# Patient Record
Sex: Female | Born: 1940 | Race: White | Hispanic: No | Marital: Married | Smoking: Never smoker
Health system: Southern US, Community
[De-identification: ages and names within clinical notes are randomized; demographics above are authoritative.]

## PROBLEM LIST (undated history)

## (undated) DIAGNOSIS — E119 Type 2 diabetes mellitus without complications: Secondary | ICD-10-CM

## (undated) DIAGNOSIS — E785 Hyperlipidemia, unspecified: Secondary | ICD-10-CM

## (undated) DIAGNOSIS — E890 Postprocedural hypothyroidism: Secondary | ICD-10-CM

## (undated) DIAGNOSIS — C73 Malignant neoplasm of thyroid gland: Secondary | ICD-10-CM

## (undated) DIAGNOSIS — C50919 Malignant neoplasm of unspecified site of unspecified female breast: Secondary | ICD-10-CM

## (undated) DIAGNOSIS — F419 Anxiety disorder, unspecified: Secondary | ICD-10-CM

## (undated) DIAGNOSIS — R21 Rash and other nonspecific skin eruption: Secondary | ICD-10-CM

## (undated) DIAGNOSIS — H359 Unspecified retinal disorder: Secondary | ICD-10-CM

## (undated) DIAGNOSIS — H938X9 Other specified disorders of ear, unspecified ear: Secondary | ICD-10-CM

## (undated) DIAGNOSIS — I10 Essential (primary) hypertension: Secondary | ICD-10-CM

## (undated) HISTORY — DX: Postprocedural hypothyroidism: E89.0

## (undated) HISTORY — DX: Anxiety disorder, unspecified: F41.9

## (undated) HISTORY — DX: Hyperlipidemia, unspecified: E78.5

## (undated) HISTORY — DX: Malignant neoplasm of thyroid gland: C73

## (undated) HISTORY — DX: Rash and other nonspecific skin eruption: R21

## (undated) HISTORY — DX: Malignant neoplasm of unspecified site of unspecified female breast: C50.919

## (undated) HISTORY — DX: Type 2 diabetes mellitus without complications: E11.9

## (undated) HISTORY — DX: Essential (primary) hypertension: I10

## (undated) HISTORY — DX: Unspecified retinal disorder: H35.9

## (undated) HISTORY — PX: MASTECTOMY: SHX3

## (undated) HISTORY — PX: OOPHORECTOMY: SHX86

## (undated) HISTORY — PX: ABDOMINAL HYSTERECTOMY: SHX81

## (undated) HISTORY — PX: THYROIDECTOMY: SHX17

## (undated) HISTORY — DX: Other specified disorders of ear, unspecified ear: H93.8X9

---

## 1989-06-04 DIAGNOSIS — C50919 Malignant neoplasm of unspecified site of unspecified female breast: Secondary | ICD-10-CM

## 1989-06-04 HISTORY — DX: Malignant neoplasm of unspecified site of unspecified female breast: C50.919

## 2005-06-04 DIAGNOSIS — C73 Malignant neoplasm of thyroid gland: Secondary | ICD-10-CM

## 2005-06-04 HISTORY — DX: Malignant neoplasm of thyroid gland: C73

## 2009-06-04 LAB — HM DEXA SCAN

## 2012-01-22 ENCOUNTER — Other Ambulatory Visit (HOSPITAL_BASED_OUTPATIENT_CLINIC_OR_DEPARTMENT_OTHER): Payer: Self-pay | Admitting: Family Medicine

## 2012-01-22 DIAGNOSIS — Z1231 Encounter for screening mammogram for malignant neoplasm of breast: Secondary | ICD-10-CM

## 2012-02-06 ENCOUNTER — Ambulatory Visit (HOSPITAL_BASED_OUTPATIENT_CLINIC_OR_DEPARTMENT_OTHER): Payer: Self-pay

## 2012-02-06 ENCOUNTER — Ambulatory Visit (HOSPITAL_BASED_OUTPATIENT_CLINIC_OR_DEPARTMENT_OTHER)
Admission: RE | Admit: 2012-02-06 | Discharge: 2012-02-06 | Disposition: A | Discharge status: Home / Self Care | Payer: Medicare Other | Source: Ambulatory Visit | Attending: Family Medicine | Admitting: Family Medicine

## 2012-02-06 DIAGNOSIS — Z1231 Encounter for screening mammogram for malignant neoplasm of breast: Secondary | ICD-10-CM

## 2012-02-06 LAB — HM MAMMOGRAPHY

## 2012-08-16 DIAGNOSIS — E890 Postprocedural hypothyroidism: Secondary | ICD-10-CM | POA: Insufficient documentation

## 2012-08-16 DIAGNOSIS — E119 Type 2 diabetes mellitus without complications: Secondary | ICD-10-CM | POA: Insufficient documentation

## 2012-08-16 DIAGNOSIS — C50919 Malignant neoplasm of unspecified site of unspecified female breast: Secondary | ICD-10-CM | POA: Insufficient documentation

## 2012-08-22 ENCOUNTER — Ambulatory Visit (INDEPENDENT_AMBULATORY_CARE_PROVIDER_SITE_OTHER): Payer: Medicare Other | Admitting: Family Medicine

## 2012-08-22 ENCOUNTER — Encounter: Payer: Self-pay | Admitting: Family Medicine

## 2012-08-22 VITALS — BP 152/78 | HR 66 | Wt 125.0 lb

## 2012-08-22 DIAGNOSIS — E039 Hypothyroidism, unspecified: Secondary | ICD-10-CM

## 2012-08-22 DIAGNOSIS — Z5181 Encounter for therapeutic drug level monitoring: Secondary | ICD-10-CM

## 2012-08-22 DIAGNOSIS — IMO0001 Reserved for inherently not codable concepts without codable children: Secondary | ICD-10-CM

## 2012-08-22 DIAGNOSIS — E785 Hyperlipidemia, unspecified: Secondary | ICD-10-CM

## 2012-08-22 MED ORDER — METFORMIN HCL 500 MG PO TABS: 500.0000 mg | ORAL_TABLET | Freq: Two times a day (BID) | ORAL | Status: DC

## 2012-08-22 MED ORDER — SIMVASTATIN 5 MG PO TABS: 5.0000 mg | ORAL_TABLET | Freq: Every day | ORAL | Status: DC

## 2012-08-22 NOTE — Patient Instructions (Addendum)
Diabetes and Exercise  Regular exercise is important and can help:   · Control blood glucose (sugar).  · Decrease blood pressure.  ·   · Control blood lipids (cholesterol, triglycerides).  · Improve overall health.  BENEFITS FROM EXERCISE  · Improved fitness.  · Improved flexibility.  · Improved endurance.  · Increased bone density.  · Weight control.  · Increased muscle strength.  · Decreased body fat.  · Improvement of the body's use of insulin, a hormone.  · Increased insulin sensitivity.  · Reduction of insulin needs.  · Reduced stress and tension.  · Helps you feel better.  People with diabetes who add exercise to their lifestyle gain additional benefits, including:  · Weight loss.  · Reduced appetite.  · Improvement of the body's use of blood glucose.  · Decreased risk factors for heart disease:  · Lowering of cholesterol and triglycerides.  · Raising the level of good cholesterol (high-density lipoproteins, HDL).  · Lowering blood sugar.  · Decreased blood pressure.  TYPE 1 DIABETES AND EXERCISE  · Exercise will usually lower your blood glucose.  · If blood glucose is greater than 240 mg/dl, check urine ketones. If ketones are present, do not exercise.  · Location of the insulin injection sites may need to be adjusted with exercise. Avoid injecting insulin into areas of the body that will be exercised. For example, avoid injecting insulin into:  · The arms when playing tennis.  · The legs when jogging. For more information, discuss this with your caregiver.  · Keep a record of:  · Food intake.  · Type and amount of exercise.  · Expected peak times of insulin action.  · Blood glucose levels.  Do this before, during, and after exercise. Review your records with your caregiver. This will help you to develop guidelines for adjusting food intake and insulin amounts.   TYPE 2 DIABETES AND EXERCISE  · Regular physical activity can help control blood glucose.  · Exercise is important because it may:  · Increase the  body's sensitivity to insulin.  · Improve blood glucose control.  · Exercise reduces the risk of heart disease. It decreases serum cholesterol and triglycerides. It also lowers blood pressure.  · Those who take insulin or oral hypoglycemic agents should watch for signs of hypoglycemia. These signs include dizziness, shaking, sweating, chills, and confusion.  · Body water is lost during exercise. It must be replaced. This will help to avoid loss of body fluids (dehydration) or heat stroke.  Be sure to talk to your caregiver before starting an exercise program to make sure it is safe for you. Remember, any activity is better than none.   Document Released: 08/11/2003 Document Revised: 08/13/2011 Document Reviewed: 11/25/2008  ExitCare® Patient Information ©2013 [employer].

## 2012-08-22 NOTE — Progress Notes (Signed)
Patient ID: Elaine Anderson, female   DOB: 06-21-1940, 72 y.o.   MRN: [id number] Chief Complaint  Patient presents with  . Medication Refills     HPI Temperance is here today with her husband to get some of her medications refilled. She has done well since her last office visit.   Past Medical History  Diagnosis Date  . Post-surgical hypothyroidism   . Diabetes type 2, controlled   . Thyroid cancer 2007  . Breast cancer 1991  . Macular disorder     macular bubble    Past Surgical History  Procedure Laterality Date  . Abdominal hysterectomy      Menorrhagia/Dysmenorrhea  . Oophorectomy      Side unspecified   . Mastectomy Left   . Thyroidectomy     No family history on file.  Social History History  Substance Use Topics  . Smoking status: Never Smoker   . Smokeless tobacco: Never Used  . Alcohol Use: No    Current Outpatient Prescriptions  Medication Sig Dispense Refill  . levothyroxine (SYNTHROID, LEVOTHROID) 100 MCG tablet Take 100 mcg by mouth daily.      . metFORMIN (GLUCOPHAGE) 500 MG tablet Take 500 mg by mouth 2 (two) times daily with a meal.      . simvastatin (ZOCOR) 5 MG tablet Take 5 mg by mouth at bedtime.      . clobetasol (OLUX) 0.05 % topical foam Apply 1 application topically 2 (two) times daily. As needed for itching      . hydrOXYzine (VISTARIL) 25 MG capsule Take 25 mg by mouth daily as needed for anxiety.      . Pramoxine-HC-Chloroxylenol 10-10-1 MG/ML SOLN Place 3 drops in ear(s) 3 (three) times daily. As needed for itching and discomfort       No current facility-administered medications for this visit.   Allergies no known allergies  Review of Systems  Constitutional: Negative.   HENT: Negative.   Respiratory: Negative.   Cardiovascular: Negative.   Gastrointestinal: Negative.   Psychiatric/Behavioral: The patient is not nervous/anxious.    BP 152/78  Pulse 66  Wt 125 lb (56.7 kg)  BMI 22.48 kg/m2 Physical Exam  Constitutional: She is  oriented to person, place, and time.  Neck: Normal range of motion. Neck supple.  Cardiovascular: Normal rate, regular rhythm and normal heart sounds.   Pulmonary/Chest: Effort normal and breath sounds normal.  Neurological: She is alert and oriented to person, place, and time.  Psychiatric: She has a normal mood and affect. Her behavior is normal. Judgment and thought content normal.    Assessment:  Type II DM Hyperlipidemia Hypothyroidism   Plan:  Refilled her metformin and simvastatin. She will continue to work on her diet and exercise.  She is doing fine on her current dosage of levothyroxine.  We'll recheck her labs in July.

## 2012-12-04 ENCOUNTER — Other Ambulatory Visit: Payer: Self-pay | Admitting: Family Medicine

## 2012-12-16 ENCOUNTER — Other Ambulatory Visit: Payer: Self-pay | Admitting: Family Medicine

## 2012-12-16 ENCOUNTER — Other Ambulatory Visit: Payer: Medicare Other

## 2012-12-16 LAB — COMPREHENSIVE METABOLIC PANEL
ALT: 33 U/L (ref 0–35)
AST: 26 U/L (ref 0–37)
Albumin: 4.1 g/dL (ref 3.5–5.2)
Alkaline Phosphatase: 45 U/L (ref 39–117)
BUN: 12 mg/dL (ref 6–23)
CO2: 27 mEq/L (ref 19–32)
Calcium: 8.8 mg/dL (ref 8.4–10.5)
Chloride: 101 mEq/L (ref 96–112)
Creat: 0.71 mg/dL (ref 0.50–1.10)
Glucose, Bld: 91 mg/dL (ref 70–99)
Potassium: 4.7 mEq/L (ref 3.5–5.3)
Sodium: 135 mEq/L (ref 135–145)
Total Bilirubin: 0.4 mg/dL (ref 0.3–1.2)
Total Protein: 6.3 g/dL (ref 6.0–8.3)

## 2012-12-16 LAB — LIPID PANEL
Cholesterol: 157 mg/dL (ref 0–200)
HDL: 50 mg/dL (ref >39)
LDL Cholesterol: 74 mg/dL (ref 0–99)
Total CHOL/HDL Ratio: 3.1 Ratio
Triglycerides: 167 mg/dL — ABNORMAL HIGH (ref <150)
VLDL: 33 mg/dL (ref 0–40)

## 2012-12-22 ENCOUNTER — Ambulatory Visit (INDEPENDENT_AMBULATORY_CARE_PROVIDER_SITE_OTHER): Payer: Medicare Other | Admitting: Family Medicine

## 2012-12-22 VITALS — BP 130/73 | HR 68 | Wt 123.0 lb

## 2012-12-22 DIAGNOSIS — Z853 Personal history of malignant neoplasm of breast: Secondary | ICD-10-CM

## 2012-12-22 DIAGNOSIS — IMO0001 Reserved for inherently not codable concepts without codable children: Secondary | ICD-10-CM

## 2012-12-22 DIAGNOSIS — E039 Hypothyroidism, unspecified: Secondary | ICD-10-CM

## 2012-12-22 DIAGNOSIS — R454 Irritability and anger: Secondary | ICD-10-CM

## 2012-12-22 DIAGNOSIS — N3281 Overactive bladder: Secondary | ICD-10-CM

## 2012-12-22 DIAGNOSIS — Z1239 Encounter for other screening for malignant neoplasm of breast: Secondary | ICD-10-CM

## 2012-12-22 DIAGNOSIS — N318 Other neuromuscular dysfunction of bladder: Secondary | ICD-10-CM

## 2012-12-22 DIAGNOSIS — E785 Hyperlipidemia, unspecified: Secondary | ICD-10-CM

## 2012-12-22 DIAGNOSIS — E119 Type 2 diabetes mellitus without complications: Secondary | ICD-10-CM

## 2012-12-22 LAB — T3, FREE: T3, Free: 2.8 pg/mL (ref 2.3–4.2)

## 2012-12-22 LAB — HM DIABETES FOOT EXAM: HM Diabetic Foot Exam: NORMAL

## 2012-12-22 LAB — TSH: TSH: 0.675 u[IU]/mL (ref 0.350–4.500)

## 2012-12-22 LAB — T4, FREE: Free T4: 1.32 ng/dL (ref 0.80–1.80)

## 2012-12-22 MED ORDER — TOLTERODINE TARTRATE ER 4 MG PO CP24: 4.0000 mg | ORAL_CAPSULE | Freq: Every day | ORAL | Status: DC

## 2012-12-22 MED ORDER — SIMVASTATIN 5 MG PO TABS: 5.0000 mg | ORAL_TABLET | Freq: Every day | ORAL | Status: DC

## 2012-12-22 MED ORDER — ESCITALOPRAM OXALATE 5 MG PO TABS: 5.0000 mg | ORAL_TABLET | Freq: Every day | ORAL | Status: DC

## 2012-12-22 MED ORDER — METFORMIN HCL 500 MG PO TABS: 500.0000 mg | ORAL_TABLET | Freq: Two times a day (BID) | ORAL | Status: DC

## 2012-12-22 NOTE — Patient Instructions (Addendum)
1)  Cholesterol & Sugar - Stay on current meds  2)  Thyroid - We'll wait on result tomorrow  3)   Urine - Toviaz 4 vs 8 mg or Vesicare 5 vs 10 or Detrol LA 4 mg (Check with insurance)   4)   Mood - Lexapro 5 mg per day   Fatigue Fatigue is a feeling of tiredness, lack of energy, lack of motivation, or feeling tired all the time. Having enough rest, good nutrition, and reducing stress will normally reduce fatigue. Consult your caregiver if it persists. The nature of your fatigue will help your caregiver to find out its cause. The treatment is based on the cause.  CAUSES  There are many causes for fatigue. Most of the time, fatigue can be traced to one or more of your habits or routines. Most causes fit into one or more of three general areas. They are: Lifestyle problems  Sleep disturbances.  Overwork.  Physical exertion.  Unhealthy habits.  Poor eating habits or eating disorders.  Alcohol and/or drug use .  Lack of proper nutrition (malnutrition). Psychological problems  Stress and/or anxiety problems.  Depression.  Grief.  Boredom. Medical Problems or Conditions  Anemia.  Pregnancy.  Thyroid gland problems.  Recovery from major surgery.  Continuous pain.  Emphysema or asthma that is not well controlled  Allergic conditions.  Diabetes.  Infections (such as mononucleosis).  Obesity.  Sleep disorders, such as sleep apnea.  Heart failure or other heart-related problems.  Cancer.  Kidney disease.  Liver disease.  Effects of certain medicines such as antihistamines, cough and cold remedies, prescription pain medicines, heart and blood pressure medicines, drugs used for treatment of cancer, and some antidepressants. SYMPTOMS  The symptoms of fatigue include:   Lack of energy.  Lack of drive (motivation).  Drowsiness.  Feeling of indifference to the surroundings. DIAGNOSIS  The details of how you feel help guide your caregiver in finding out  what is causing the fatigue. You will be asked about your present and past health condition. It is important to review all medicines that you take, including prescription and non-prescription items. A thorough exam will be done. You will be questioned about your feelings, habits, and normal lifestyle. Your caregiver may suggest blood tests, urine tests, or other tests to look for common medical causes of fatigue.  TREATMENT  Fatigue is treated by correcting the underlying cause. For example, if you have continuous pain or depression, treating these causes will improve how you feel. Similarly, adjusting the dose of certain medicines will help in reducing fatigue.  HOME CARE INSTRUCTIONS   Try to get the required amount of good sleep every night.  Eat a healthy and nutritious diet, and drink enough water throughout the day.  Practice ways of relaxing (including yoga or meditation).  Exercise regularly.  Make plans to change situations that cause stress. Act on those plans so that stresses decrease over time. Keep your work and personal routine reasonable.  Avoid street drugs and minimize use of alcohol.  Start taking a daily multivitamin after consulting your caregiver. SEEK MEDICAL CARE IF:   You have persistent tiredness, which cannot be accounted for.  You have fever.  You have unintentional weight loss.  You have headaches.  You have disturbed sleep throughout the night.  You are feeling sad.  You have constipation.  You have dry skin.  You have gained weight.  You are taking any new or different medicines that you suspect are causing fatigue.  You are unable to sleep at night.  You develop any unusual swelling of your legs or other parts of your body. SEEK IMMEDIATE MEDICAL CARE IF:   You are feeling confused.  Your vision is blurred.  You feel faint or pass out.  You develop severe headache.  You develop severe abdominal, pelvic, or back pain.  You develop  chest pain, shortness of breath, or an irregular or fast heartbeat.  You are unable to pass a normal amount of urine.  You develop abnormal bleeding such as bleeding from the rectum or you vomit blood.  You have thoughts about harming yourself or committing suicide.  You are worried that you might harm someone else. MAKE SURE YOU:   Understand these instructions.  Will watch your condition.  Will get help right away if you are not doing well or get worse. Document Released: 03/18/2007 Document Revised: 08/13/2011 Document Reviewed: 03/18/2007 [facility] Patient Information 2014 [place], [place].

## 2012-12-22 NOTE — Progress Notes (Signed)
  Subjective:    Patient ID: Elaine Anderson, female    DOB: 30-Dec-1940, 72 y.o.   MRN: [id number]  HPI  Elaine Anderson is here today with her husband Jonny Ruiz) to go over her most recent lab results, get medication refills and to discuss the following issues:    1)  Hypothyroidism:  She has been feeling more tired lately.  She and her husband wonder if she needs to have her Synthroid level adjusted.      2)  Type II DM:  She continues taking her Metformin and watching her diet.  She needs a refill on both this medication and her diabetic supplies.  3)  Mood:  Her husband feels that she has been more moody lately.    4)  Overactive Bladder:  She has been having increased trouble with this over the past several months.      Review of Systems  Constitutional: Positive for fatigue. Negative for appetite change and unexpected weight change.  HENT: Negative.   Eyes: Negative.   Respiratory: Negative for chest tightness and shortness of breath.   Cardiovascular: Negative for chest pain, palpitations and leg swelling.  Endocrine: Negative for polydipsia, polyphagia and polyuria.  Genitourinary: Positive for urgency.  Psychiatric/Behavioral:       Increased irritability   Past Medical History  Diagnosis Date  . Diabetes type 2, controlled   . Macular disorder     macular bubble   . Breast cancer 1991  . Post-surgical hypothyroidism   . Thyroid cancer 2007  . Hyperlipidemia   . Anxiety   . Rash and other nonspecific skin eruption   . Other disorders of ear(388.8)    No family history on file. History   Social History Narrative   Marital Status: Married Chiropractor)   Children:  Daughter Darel Hong)    Pets: None    Living Situation: Lives with husband    Occupation: Retired Administrator, arts)    Education: She did not finish school ([employer])     Tobacco Use:  None    Alcohol Use:  None    Drug Use:  None   Diet:  Low Carb    Exercise:  None   Hobbies: Sewing                 Objective:   Physical Exam  Constitutional: She appears well-nourished. No distress.  HENT:  Head: Normocephalic.  Eyes: No scleral icterus.  Neck: No thyromegaly present.  Cardiovascular: Normal rate, regular rhythm and normal heart sounds.   Pulmonary/Chest: Effort normal and breath sounds normal.  Abdominal: There is no tenderness.  Musculoskeletal: She exhibits no edema and no tenderness.  Neurological: She is alert.  Skin: Skin is warm and dry.  Psychiatric: She has a normal mood and affect. Her behavior is normal. Judgment and thought content normal.          Assessment & Plan:

## 2012-12-23 ENCOUNTER — Encounter: Payer: Self-pay | Admitting: Family Medicine

## 2012-12-23 ENCOUNTER — Telehealth: Payer: Self-pay

## 2012-12-23 DIAGNOSIS — Z1239 Encounter for other screening for malignant neoplasm of breast: Secondary | ICD-10-CM | POA: Insufficient documentation

## 2012-12-23 DIAGNOSIS — E785 Hyperlipidemia, unspecified: Secondary | ICD-10-CM | POA: Insufficient documentation

## 2012-12-23 DIAGNOSIS — Z853 Personal history of malignant neoplasm of breast: Secondary | ICD-10-CM | POA: Insufficient documentation

## 2012-12-23 DIAGNOSIS — IMO0001 Reserved for inherently not codable concepts without codable children: Secondary | ICD-10-CM | POA: Insufficient documentation

## 2012-12-23 DIAGNOSIS — N3281 Overactive bladder: Secondary | ICD-10-CM | POA: Insufficient documentation

## 2012-12-23 DIAGNOSIS — E039 Hypothyroidism, unspecified: Secondary | ICD-10-CM | POA: Insufficient documentation

## 2012-12-23 DIAGNOSIS — R454 Irritability and anger: Secondary | ICD-10-CM | POA: Insufficient documentation

## 2012-12-23 MED ORDER — LEVOTHYROXINE SODIUM 100 MCG PO TABS: 100.0000 ug | ORAL_TABLET | Freq: Every day | ORAL | Status: DC

## 2012-12-23 NOTE — Assessment & Plan Note (Addendum)
She was given several samples (Toviaz & Vesicar) to try along with a prescription for Detrol.

## 2012-12-23 NOTE — Assessment & Plan Note (Signed)
She will remain on the low dosage of simastatin.

## 2012-12-23 NOTE — Telephone Encounter (Signed)
A call was made to Elaine Anderson to find out what Diabetic supplies she use, so that we can go ahead and call them in for her. There was no answer so I left a vm asking her to give [place] a call with the information at this time. LB

## 2012-12-23 NOTE — Assessment & Plan Note (Signed)
She is to remain on Wellbutrin.

## 2012-12-23 NOTE — Assessment & Plan Note (Signed)
She was given a prescription for Lexapro.

## 2012-12-24 ENCOUNTER — Other Ambulatory Visit: Payer: Self-pay

## 2012-12-24 MED ORDER — GLUCOSE BLOOD VI STRP: ORAL_STRIP | Status: AC

## 2012-12-24 NOTE — Telephone Encounter (Signed)
Rx for test strips have been sent to pharmacy at this time. LB

## 2013-01-01 ENCOUNTER — Other Ambulatory Visit: Payer: Medicare Other

## 2013-02-18 ENCOUNTER — Ambulatory Visit (INDEPENDENT_AMBULATORY_CARE_PROVIDER_SITE_OTHER): Payer: Medicare Other | Admitting: Family Medicine

## 2013-02-18 ENCOUNTER — Encounter: Payer: Self-pay | Admitting: Family Medicine

## 2013-02-18 ENCOUNTER — Other Ambulatory Visit: Payer: Self-pay | Admitting: Family Medicine

## 2013-02-18 VITALS — BP 164/78 | HR 63 | Resp 16 | Wt 122.0 lb

## 2013-02-18 DIAGNOSIS — H6120 Impacted cerumen, unspecified ear: Secondary | ICD-10-CM

## 2013-02-18 DIAGNOSIS — H9201 Otalgia, right ear: Secondary | ICD-10-CM

## 2013-02-18 DIAGNOSIS — Z1231 Encounter for screening mammogram for malignant neoplasm of breast: Secondary | ICD-10-CM

## 2013-02-18 DIAGNOSIS — H9209 Otalgia, unspecified ear: Secondary | ICD-10-CM

## 2013-02-18 DIAGNOSIS — I10 Essential (primary) hypertension: Secondary | ICD-10-CM

## 2013-02-18 DIAGNOSIS — H6121 Impacted cerumen, right ear: Secondary | ICD-10-CM

## 2013-02-18 MED ORDER — LOSARTAN POTASSIUM 50 MG PO TABS: 50.0000 mg | ORAL_TABLET | Freq: Every day | ORAL | Status: DC

## 2013-02-18 MED ORDER — CIPROFLOXACIN-DEXAMETHASONE 0.3-0.1 % OT SUSP: 4.0000 [drp] | Freq: Two times a day (BID) | OTIC | Status: DC

## 2013-02-18 MED ORDER — CIPROFLOXACIN-HYDROCORTISONE 0.2-1 % OT SUSP: 4.0000 [drp] | Freq: Two times a day (BID) | OTIC | Status: AC

## 2013-02-18 MED ORDER — PRAMOXINE-HC-CHLOROXYLENOL AQ 10-10-1 MG/ML OT SOLN: 3.0000 [drp] | Freq: Three times a day (TID) | OTIC | Status: DC

## 2013-02-18 NOTE — Patient Instructions (Addendum)
1)  Ears - Use Ciprodex vs Cipro HC Otic for 7 days.  You may also want to try the Cortane B daily - couple of times per week to prevent ear irritation.  Do not use Q-Tips inside of your canal because it pushes down wax into your ear canal.  If your ear does not improve then F/U with an ENT.    2)  BP - If your BP remains elevated, try taking the losartan 50 mg daily.  Cut down/out your salt.  DASH diet is helpful in lowering pressure.  Google  pdf "Your Guide to Lowering Your BP with Dash".    DASH Diet The DASH diet stands for "Dietary Approaches to Stop Hypertension." It is a healthy eating plan that has been shown to reduce high blood pressure (hypertension) in as little as 14 days, while also possibly providing other significant health benefits. These other health benefits include reducing the risk of breast cancer after menopause and reducing the risk of type 2 diabetes, heart disease, colon cancer, and stroke. Health benefits also include weight loss and slowing kidney failure in patients with chronic kidney disease.  DIET GUIDELINES  Limit salt (sodium). Your diet should contain less than 1500 mg of sodium daily.  Limit refined or processed carbohydrates. Your diet should include mostly whole grains. Desserts and added sugars should be used sparingly.  Include small amounts of heart-healthy fats. These types of fats include nuts, oils, and tub margarine. Limit saturated and trans fats. These fats have been shown to be harmful in the body. CHOOSING FOODS  The following food groups are based on a 2000 calorie diet. See your Registered Dietitian for individual calorie needs. Grains and Grain Products (6 to 8 servings daily)  Eat More Often: Whole-wheat bread, brown rice, whole-grain or wheat pasta, quinoa, popcorn without added fat or salt (air popped).  Eat Less Often: White bread, white pasta, white rice, cornbread. Vegetables (4 to 5 servings daily)  Eat More Often: Fresh, frozen, and  canned vegetables. Vegetables may be raw, steamed, roasted, or grilled with a minimal amount of fat.  Eat Less Often/Avoid: Creamed or fried vegetables. Vegetables in a cheese sauce. Fruit (4 to 5 servings daily)  Eat More Often: All fresh, canned (in natural juice), or frozen fruits. Dried fruits without added sugar. One hundred percent fruit juice ( cup [237 mL] daily).  Eat Less Often: Dried fruits with added sugar. Canned fruit in light or heavy syrup. [employer], Fish, and Poultry (2 servings or less daily. One serving is 3 to 4 oz [85-114 g]).  Eat More Often: Ninety percent or leaner ground beef, tenderloin, sirloin. Round cuts of beef, chicken breast, [place] breast. All fish. Grill, bake, or broil your meat. Nothing should be fried.  Eat Less Often/Avoid: Fatty cuts of meat, [place], or chicken leg, thigh, or wing. Fried cuts of meat or fish. Dairy (2 to 3 servings)  Eat More Often: Low-fat or fat-free milk, low-fat plain or light yogurt, reduced-fat or part-skim cheese.  Eat Less Often/Avoid: Milk (whole, 2%).Whole milk yogurt. Full-fat cheeses. Nuts, Seeds, and Legumes (4 to 5 servings per week)  Eat More Often: All without added salt.  Eat Less Often/Avoid: Salted nuts and seeds, canned beans with added salt. Fats and Sweets (limited)  Eat More Often: Vegetable oils, tub margarines without trans fats, sugar-free gelatin. Mayonnaise and salad dressings.  Eat Less Often/Avoid: Coconut oils, palm oils, butter, stick margarine, cream, half and half, cookies, candy, pie. FOR  MORE INFORMATION The Dash Diet Eating Plan: www.dashdiet.org Document Released: 05/10/2011 Document Revised: 08/13/2011 Document Reviewed: 05/10/2011 [facility] Patient Information 2014 [place], [place].

## 2013-02-18 NOTE — Progress Notes (Signed)
  Subjective:    Patient ID: Elaine Anderson, female    DOB: 12-23-1940, 72 y.o.   MRN: [id number]  Elaine Anderson is here today complaining of ear pain and a pink discharge from her ear.      Otalgia  There is pain in the right ear. The current episode started in the past 7 days. The problem occurs constantly. The problem has been gradually worsening. There has been no fever. The pain is at a severity of 6/10. The pain is moderate. Associated symptoms include drainage and neck pain. She has tried nothing for the symptoms. Her past medical history is significant for a chronic ear infection.     Review of Systems  Constitutional: Negative.   HENT: Positive for ear pain and neck pain.   Eyes: Negative.   Respiratory: Negative.   Cardiovascular: Negative.   Gastrointestinal: Negative.   Endocrine: Negative.   Genitourinary: Negative.   Allergic/Immunologic: Negative.   Neurological: Negative.   Hematological: Negative.      Past Medical History  Diagnosis Date  . Diabetes type 2, controlled   . Macular disorder     macular bubble   . Breast cancer 1991  . Post-surgical hypothyroidism   . Thyroid cancer 2007  . Hyperlipidemia   . Anxiety   . Rash and other nonspecific skin eruption   . Other disorders of ear(388.8)      History   Social History Narrative   Marital Status: Married Chiropractor)   Children:  Daughter Darel Hong)    Pets: None    Living Situation: Lives with husband    Occupation: Retired Administrator, arts)    Education: She did not finish school ([employer])     Tobacco Use:  None    Alcohol Use:  None    Drug Use:  None   Diet:  Low Carb    Exercise:  None   Hobbies: Sewing                Objective:   Physical Exam  Vitals reviewed. Constitutional: She is oriented to person, place, and time. She appears well-developed and well-nourished.  HENT:  Ears:  Cardiovascular: Normal rate and regular rhythm.   Pulmonary/Chest: Effort normal and breath sounds normal.   Neurological: She is alert and oriented to person, place, and time.  Skin: Skin is warm and dry.  Psychiatric: She has a normal mood and affect.      Assessment & Plan:

## 2013-02-23 ENCOUNTER — Ambulatory Visit (HOSPITAL_BASED_OUTPATIENT_CLINIC_OR_DEPARTMENT_OTHER)
Admission: RE | Admit: 2013-02-23 | Discharge: 2013-02-23 | Disposition: A | Discharge status: Home / Self Care | Payer: Medicare Other | Source: Ambulatory Visit | Attending: Family Medicine | Admitting: Family Medicine

## 2013-02-23 DIAGNOSIS — Z1231 Encounter for screening mammogram for malignant neoplasm of breast: Secondary | ICD-10-CM | POA: Insufficient documentation

## 2013-04-13 ENCOUNTER — Encounter: Payer: Self-pay | Admitting: *Deleted

## 2013-04-20 ENCOUNTER — Encounter: Payer: Self-pay | Admitting: *Deleted

## 2013-05-03 DIAGNOSIS — I10 Essential (primary) hypertension: Secondary | ICD-10-CM | POA: Insufficient documentation

## 2013-05-03 DIAGNOSIS — H9209 Otalgia, unspecified ear: Secondary | ICD-10-CM | POA: Insufficient documentation

## 2013-05-03 DIAGNOSIS — H6121 Impacted cerumen, right ear: Secondary | ICD-10-CM | POA: Insufficient documentation

## 2013-05-03 NOTE — Assessment & Plan Note (Signed)
She was given prescriptions for drops to help her symptoms.  If this problem continues, she is to F/U with [facility].

## 2013-05-03 NOTE — Assessment & Plan Note (Signed)
She was given information on the DASH diet and was given a prescription for losartan.

## 2013-05-03 NOTE — Assessment & Plan Note (Signed)
Her ears were flushed.

## 2013-08-20 ENCOUNTER — Ambulatory Visit: Payer: Medicare Other | Admitting: Family Medicine

## 2013-08-24 ENCOUNTER — Other Ambulatory Visit: Payer: Self-pay | Admitting: *Deleted

## 2013-08-24 ENCOUNTER — Other Ambulatory Visit: Payer: Self-pay | Admitting: Family Medicine

## 2013-08-24 DIAGNOSIS — E785 Hyperlipidemia, unspecified: Secondary | ICD-10-CM

## 2013-08-24 DIAGNOSIS — R5383 Other fatigue: Secondary | ICD-10-CM

## 2013-08-24 DIAGNOSIS — E039 Hypothyroidism, unspecified: Secondary | ICD-10-CM

## 2013-08-24 DIAGNOSIS — R5381 Other malaise: Secondary | ICD-10-CM

## 2013-08-25 ENCOUNTER — Other Ambulatory Visit: Payer: Medicare Other

## 2013-08-25 LAB — COMPLETE METABOLIC PANEL WITH GFR
ALT: 66 U/L — ABNORMAL HIGH (ref 0–35)
AST: 59 U/L — ABNORMAL HIGH (ref 0–37)
Albumin: 4.1 g/dL (ref 3.5–5.2)
Alkaline Phosphatase: 46 U/L (ref 39–117)
BUN: 14 mg/dL (ref 6–23)
CO2: 29 mEq/L (ref 19–32)
Calcium: 9 mg/dL (ref 8.4–10.5)
Chloride: 102 mEq/L (ref 96–112)
Creat: 0.73 mg/dL (ref 0.50–1.10)
GFR, Est African American: >89 mL/min
GFR, Est Non African American: 83 mL/min
Glucose, Bld: 109 mg/dL — ABNORMAL HIGH (ref 70–99)
Potassium: 4.4 mEq/L (ref 3.5–5.3)
Sodium: 139 mEq/L (ref 135–145)
Total Bilirubin: 0.6 mg/dL (ref 0.2–1.2)
Total Protein: 6.4 g/dL (ref 6.0–8.3)

## 2013-08-25 LAB — CBC WITH DIFFERENTIAL/PLATELET
Basophils Absolute: 0 10*3/uL (ref 0.0–0.1)
Basophils Relative: 0 % (ref 0–1)
Eosinophils Absolute: 0 10*3/uL (ref 0.0–0.7)
Eosinophils Relative: 1 % (ref 0–5)
HCT: 36.8 % (ref 36.0–46.0)
Hemoglobin: 12.5 g/dL (ref 12.0–15.0)
Lymphocytes Relative: 27 % (ref 12–46)
Lymphs Abs: 1.2 10*3/uL (ref 0.7–4.0)
MCH: 30 pg (ref 26.0–34.0)
MCHC: 34 g/dL (ref 30.0–36.0)
MCV: 88.5 fL (ref 78.0–100.0)
Monocytes Absolute: 0.3 10*3/uL (ref 0.1–1.0)
Monocytes Relative: 6 % (ref 3–12)
Neutro Abs: 3 10*3/uL (ref 1.7–7.7)
Neutrophils Relative %: 66 % (ref 43–77)
Platelets: 240 10*3/uL (ref 150–400)
RBC: 4.16 MIL/uL (ref 3.87–5.11)
RDW: 14 % (ref 11.5–15.5)
WBC: 4.5 10*3/uL (ref 4.0–10.5)

## 2013-08-25 LAB — LIPID PANEL
Cholesterol: 190 mg/dL (ref 0–200)
HDL: 60 mg/dL (ref >39)
LDL Cholesterol: 109 mg/dL — ABNORMAL HIGH (ref 0–99)
Total CHOL/HDL Ratio: 3.2 Ratio
Triglycerides: 105 mg/dL (ref <150)
VLDL: 21 mg/dL (ref 0–40)

## 2013-08-26 LAB — HEMOGLOBIN A1C
Hgb A1c MFr Bld: 6.3 % — ABNORMAL HIGH (ref <5.7)
Mean Plasma Glucose: 134 mg/dL — ABNORMAL HIGH (ref <117)

## 2013-08-26 LAB — T4, FREE: Free T4: 1.3 ng/dL (ref 0.80–1.80)

## 2013-08-26 LAB — TSH: TSH: 3.246 u[IU]/mL (ref 0.350–4.500)

## 2013-09-10 ENCOUNTER — Encounter: Payer: Self-pay | Admitting: Family Medicine

## 2013-09-10 ENCOUNTER — Ambulatory Visit (INDEPENDENT_AMBULATORY_CARE_PROVIDER_SITE_OTHER): Payer: Medicare Other | Admitting: Family Medicine

## 2013-09-10 VITALS — BP 133/81 | HR 78 | Resp 16 | Ht 62.0 in | Wt 125.0 lb

## 2013-09-10 DIAGNOSIS — R748 Abnormal levels of other serum enzymes: Secondary | ICD-10-CM

## 2013-09-10 DIAGNOSIS — E039 Hypothyroidism, unspecified: Secondary | ICD-10-CM

## 2013-09-10 DIAGNOSIS — E119 Type 2 diabetes mellitus without complications: Secondary | ICD-10-CM

## 2013-09-10 DIAGNOSIS — F411 Generalized anxiety disorder: Secondary | ICD-10-CM | POA: Insufficient documentation

## 2013-09-10 DIAGNOSIS — I10 Essential (primary) hypertension: Secondary | ICD-10-CM

## 2013-09-10 DIAGNOSIS — N318 Other neuromuscular dysfunction of bladder: Secondary | ICD-10-CM

## 2013-09-10 DIAGNOSIS — E785 Hyperlipidemia, unspecified: Secondary | ICD-10-CM

## 2013-09-10 DIAGNOSIS — N3281 Overactive bladder: Secondary | ICD-10-CM

## 2013-09-10 MED ORDER — SIMVASTATIN 5 MG PO TABS: 5.0000 mg | ORAL_TABLET | Freq: Every day | ORAL | Status: DC

## 2013-09-10 MED ORDER — TOLTERODINE TARTRATE ER 4 MG PO CP24: 4.0000 mg | ORAL_CAPSULE | Freq: Every day | ORAL | Status: DC

## 2013-09-10 MED ORDER — METFORMIN HCL 500 MG PO TABS: 500.0000 mg | ORAL_TABLET | Freq: Two times a day (BID) | ORAL | Status: DC

## 2013-09-10 MED ORDER — LEVOTHYROXINE SODIUM 112 MCG PO TABS: 112.0000 ug | ORAL_TABLET | Freq: Every day | ORAL | Status: DC

## 2013-09-10 NOTE — Progress Notes (Signed)
Subjective:    Patient ID: Elaine Anderson, female    DOB: 1940-10-11, 74 y.o.   MRN: [id number]  HPI  Elaine Anderson is here today to go over her recent lab results and to go over the following issues:  1)  Hypertension:  She was given a prescription for losartan at her last visit but never started taking it.      2)  Hyperlipidemia:  She is taking simvastatin 5 mg  and is doing well on it. She is needing a refill and would like to get 90 day supply.  3)  Hypothyroidism:  She is taking the Levothyroxine 100 mcg however she feels very tired and thinks it needs to be adjusted.   4)  Over Active Bladder:  She takes the Detrol LA.  She is needing a refill.  5)  Anxiety:  She is not taking the Lexapro. She thinks that her mood is fine.   6)  Type II DM:  She is taking metformin and is doing well on it.     Review of Systems  Constitutional: Positive for fatigue. Negative for activity change, appetite change and unexpected weight change.  HENT: Negative.   Eyes: Negative.   Respiratory: Negative for chest tightness and shortness of breath.   Cardiovascular: Negative for chest pain, palpitations and leg swelling.  Gastrointestinal: Negative for diarrhea and constipation.  Endocrine: Positive for cold intolerance. Negative for polydipsia, polyphagia and polyuria.  Genitourinary: Negative for urgency, frequency and difficulty urinating.  Musculoskeletal: Negative.   Skin: Negative.   Neurological: Negative.  Negative for light-headedness and numbness.  Hematological: Negative for adenopathy. Does not bruise/bleed easily.  Psychiatric/Behavioral: Negative for sleep disturbance and dysphoric mood. The patient is nervous/anxious.   All other systems reviewed and are negative.    Past Medical History  Diagnosis Date  . Diabetes type 2, controlled   . Macular disorder     macular bubble   . Breast cancer 1991  . Post-surgical hypothyroidism   . Thyroid cancer 2007  . Hyperlipidemia   .  Anxiety   . Rash and other nonspecific skin eruption   . Other disorders of ear(388.8)   . Hypertension      Past Surgical History  Procedure Laterality Date  . Oophorectomy      Side unspecified   . Mastectomy Left   . Thyroidectomy    . Abdominal hysterectomy      Menorrhagia/Dysmenorrhea     History   Social History Narrative   Marital Status: Married Chief Technology Officer)   Children:  Daughter Bethena Roys)    Pets: None    Living Situation: Lives with husband    Occupation: Retired Scientist, product/process development)    Education: She did not finish school ([place])     Tobacco Use:  None    Alcohol Use:  None    Drug Use:  None   Diet:  Low Carb    Exercise:  None   Hobbies: Sewing               Family History  Problem Relation Age of Onset  . Family history unknown: Yes     Current Outpatient Prescriptions on File Prior to Visit  Medication Sig Dispense Refill  . glucose blood test strip Use as instructed  100 each  3   No current facility-administered medications on file prior to visit.     No Known Allergies      Objective:   Physical Exam  Nursing note and vitals reviewed.  Constitutional: She appears well-nourished. No distress.  HENT:  Head: Normocephalic.  Eyes: No scleral icterus.  Neck: No thyromegaly present.  Cardiovascular: Normal rate, regular rhythm and normal heart sounds.   Pulmonary/Chest: Effort normal and breath sounds normal.  Abdominal: There is no tenderness.  Musculoskeletal: She exhibits no edema and no tenderness.  Neurological: She is alert.  Skin: Skin is warm and dry.  Psychiatric: She has a normal mood and affect. Her behavior is normal. Judgment and thought content normal.      Assessment & Plan:   Elaine Anderson was seen today for medication management. Her plan is as follows:    1)  Blood Pressure - Your pressure was good today so since you have not started the losartan that you were given in September, I guess you can continue to not take it.  2)   Blood Sugar - Your A1c is up a little so work a little harder on your diet and EXERCISE.    3)  Cholesterol - Your LDL is a little higher. This should go back to normal with improving diet.    4)  Hypothyroidism - You have a little "wiggle room" in your thyroid level so let's increase your dosage slightly to see if this will increase your energy.  Finish up the 100 mcg and then take the 112 mcg.  We'll recheck your level in 3 months to make sure that you are still in normal range.   5)  Elevated Liver Enzymes - Your liver enzymes are a little elevated so you need to watch your intake of fatty food and take 400 IU of Vitamin E daily.  We'll recheck your levels in 3 months.  I will also be checking a Hepatitis Panel at that time.    6)  Overactive Bladder -  Stay on the Detrol as needed.     Essential hypertension, benign Comments: Her BP looks better than her last visit so I guess she does not need the losartan.   Other and unspecified hyperlipidemia Comments: Her LDL is a little higher but her triglycerides are better.   - simvastatin (ZOCOR) 5 MG tablet; Take 1 tablet (5 mg total) by mouth at bedtime.  Type II or unspecified type diabetes mellitus without mention of complication, not stated as uncontrolled Comments: Her A1c is a little elevated at 6.3%.    - metFORMIN (GLUCOPHAGE) 500 MG tablet; Take 1 tablet (500 mg total) by mouth 2 (two) times daily with a meal.  Anxiety state, unspecified  OAB (overactive bladder) - tolterodine (DETROL LA) 4 MG 24 hr capsule; Take 1 capsule (4 mg total) by mouth daily. - Discontinue: mirabegron ER (MYRBETRIQ) 50 MG TB24 tablet; Take 1 tablet (50 mg total) by mouth daily.  Unspecified hypothyroidism -     levothyroxine (SYNTHROID, LEVOTHROID) 112 MCG tablet; Take 1 tablet (112 mcg total) by mouth daily. - TSH; Future - T4, free; Future - T3, free; Future  Elevated liver enzymes - COMPLETE METABOLIC PANEL WITH GFR; Future - Hepatitis C  antibody; Future - Hepatitis B surface antigen; Future   TIME SPENT "FACE TO FACE" WITH PATIENT -  62 MINS

## 2013-09-10 NOTE — Patient Instructions (Signed)
1)  Blood Pressure - Your pressure was good today so since you have not started the losartan that you were given in September, I guess you can continue to not take it.  2)  Blood Sugar - Your A1c is up a little so work a little harder on your diet and EXERCISE.    3)  Cholesterol - Your LDL is a little higher. This should go back to normal with improving diet.    4)  Hypothyroidism - You have a little "wiggle room" in your thyroid level so let's increase your dosage slightly to see if this will increase your energy.  Finish up the 100 mcg and then take the 112 mcg.  We'll recheck your level in 3 months to make sure that you are still in normal range.   5)  Elevated Liver Enzymes - Your liver enzymes are a little elevated so you need to watch your intake of fatty food and take 400 IU of Vitamin E daily.  We'll recheck your levels in 3 months.  I will also be checking a Hepatitis Panel at that time.    6)  Overactive Bladder -  Stay on the Detrol as needed.    Fatty Liver Fatty liver is the accumulation of fat in liver cells. It is also called hepatosteatosis or steatohepatitis. It is normal for your liver to contain some fat. If fat is more than 5 to 10% of your liver's weight, you have fatty liver.  There are often no symptoms (problems) for years while damage is still occurring. People often learn about their fatty liver when they have medical tests for other reasons. Fat can damage your liver for years or even decades without causing problems. When it becomes severe, it can cause fatigue, weight loss, weakness, and confusion. This makes you more likely to develop more serious liver problems. The liver is the largest organ in the body. It does a lot of work and often gives no warning signs when it is sick until late in a disease. The liver has many important jobs including:  Breaking down foods.  Storing vitamins, iron, and other minerals.  Making proteins.  Making bile for food  digestion.  Breaking down many products including medications, alcohol and some poisons. CAUSES  There are a number of different conditions, medications, and poisons that can cause a fatty liver. Eating too many calories causes fat to build up in the liver. Not processing and breaking fats down normally may also cause this. Certain conditions, such as obesity, diabetes, and high triglycerides also cause this. Most fatty liver patients tend to be middle-aged and over weight.  Some causes of fatty liver are:  Alcohol over consumption.  Malnutrition.  Steroid use.  Valproic acid toxicity.  Obesity.  Cushing's syndrome.  Poisons.  Tetracycline in high dosages.  Pregnancy.  Diabetes.  Hyperlipidemia.  Rapid weight loss. Some people develop fatty liver even having none of these conditions. SYMPTOMS  Fatty liver most often causes no problems. This is called asymptomatic.  It can be diagnosed with blood tests and also by a liver biopsy.  It is one of the most common causes of minor elevations of liver enzymes on routine blood tests.  Specialized Imaging of the liver using ultrasound, CT (computed tomography) scan, or MRI (magnetic resonance imaging) can suggest a fatty liver but a biopsy is needed to confirm it.  A biopsy involves taking a small sample of liver tissue. This is done by using a needle.  It is then looked at under a microscope by a specialist. TREATMENT  It is important to treat the cause. Simple fatty liver without a medical reason may not need treatment.  Weight loss, fat restriction, and exercise in overweight patients produces inconsistent results but is worth trying.  Fatty liver due to alcohol toxicity may not improve even with stopping drinking.  Good control of diabetes may reduce fatty liver.  Lower your triglycerides through diet, medication or both.  Eat a balanced, healthy diet.  Increase your physical activity.  Get regular checkups from a  liver specialist.  There are no medical or surgical treatments for a fatty liver or NASH, but improving your diet and increasing your exercise may help prevent or reverse some of the damage. PROGNOSIS  Fatty liver may cause no damage or it can lead to an inflammation of the liver. This is, called steatohepatitis. When it is linked to alcohol abuse, it is called alcoholic steatohepatitis. It often is not linked to alcohol. It is then called nonalcoholic steatohepatitis, or NASH. Over time the liver may become scarred and hardened. This condition is called cirrhosis. Cirrhosis is serious and may lead to liver failure or cancer. NASH is one of the leading causes of cirrhosis. About 10-20% of Americans have fatty liver and a smaller 2-5% has NASH. Document Released: 07/06/2005 Document Revised: 08/13/2011 Document Reviewed: 08/29/2005 [facility] Patient Information 2014 [place].

## 2013-09-17 ENCOUNTER — Telehealth: Payer: Self-pay | Admitting: *Deleted

## 2013-09-17 DIAGNOSIS — E039 Hypothyroidism, unspecified: Secondary | ICD-10-CM

## 2013-09-17 MED ORDER — LEVOTHYROXINE SODIUM 112 MCG PO TABS: 112.0000 ug | ORAL_TABLET | Freq: Every day | ORAL | Status: DC

## 2013-09-17 NOTE — Telephone Encounter (Signed)
FYI - Dr. Dion Saucier her husband requested the prescription "dispense as written" but it was not done that way when her Rx was printed on 09/10/13.  I sent it with that box marked.  PG

## 2013-09-24 MED ORDER — MIRABEGRON ER 50 MG PO TB24: 50.0000 mg | ORAL_TABLET | Freq: Every day | ORAL | Status: DC

## 2013-09-28 ENCOUNTER — Other Ambulatory Visit: Payer: Self-pay | Admitting: *Deleted

## 2013-09-28 DIAGNOSIS — N3281 Overactive bladder: Secondary | ICD-10-CM

## 2013-09-28 MED ORDER — MIRABEGRON ER 50 MG PO TB24: 50.0000 mg | ORAL_TABLET | Freq: Every day | ORAL | Status: DC

## 2013-10-20 ENCOUNTER — Telehealth: Payer: Self-pay

## 2013-10-20 NOTE — Telephone Encounter (Signed)
Ms Pavlicek would like to get medication Oxybutynin instead of Nybatrig called into the [facility] at [address] [place]. The Nyrbatrig is over $200.00. The other one is 10 times cheeper.

## 2013-12-09 ENCOUNTER — Other Ambulatory Visit: Payer: Self-pay | Admitting: *Deleted

## 2013-12-09 DIAGNOSIS — E039 Hypothyroidism, unspecified: Secondary | ICD-10-CM

## 2013-12-09 DIAGNOSIS — R748 Abnormal levels of other serum enzymes: Secondary | ICD-10-CM

## 2013-12-10 ENCOUNTER — Other Ambulatory Visit: Payer: Medicare Other

## 2013-12-10 LAB — COMPLETE METABOLIC PANEL WITH GFR
ALT: 22 U/L (ref 0–35)
AST: 25 U/L (ref 0–37)
Albumin: 4.3 g/dL (ref 3.5–5.2)
Alkaline Phosphatase: 46 U/L (ref 39–117)
BUN: 15 mg/dL (ref 6–23)
CO2: 25 mEq/L (ref 19–32)
Calcium: 9.1 mg/dL (ref 8.4–10.5)
Chloride: 99 mEq/L (ref 96–112)
Creat: 0.71 mg/dL (ref 0.50–1.10)
GFR, Est African American: >89 mL/min
GFR, Est Non African American: 85 mL/min
Glucose, Bld: 99 mg/dL (ref 70–99)
Potassium: 4.2 mEq/L (ref 3.5–5.3)
Sodium: 134 mEq/L — ABNORMAL LOW (ref 135–145)
Total Bilirubin: 0.6 mg/dL (ref 0.2–1.2)
Total Protein: 6.7 g/dL (ref 6.0–8.3)

## 2013-12-10 LAB — HEPATITIS B SURFACE ANTIGEN: Hepatitis B Surface Ag: NEGATIVE

## 2013-12-10 LAB — T4, FREE: Free T4: 1.59 ng/dL (ref 0.80–1.80)

## 2013-12-10 LAB — HEPATITIS C ANTIBODY: HCV Ab: NEGATIVE

## 2013-12-10 LAB — TSH: TSH: 0.367 u[IU]/mL (ref 0.350–4.500)

## 2013-12-10 LAB — T3, FREE: T3, Free: 3.1 pg/mL (ref 2.3–4.2)

## 2013-12-15 ENCOUNTER — Ambulatory Visit (INDEPENDENT_AMBULATORY_CARE_PROVIDER_SITE_OTHER): Payer: Medicare Other | Admitting: Family Medicine

## 2013-12-15 ENCOUNTER — Encounter: Payer: Self-pay | Admitting: Family Medicine

## 2013-12-15 VITALS — BP 149/83 | HR 72 | Resp 16 | Ht 62.0 in | Wt 124.0 lb

## 2013-12-15 DIAGNOSIS — E038 Other specified hypothyroidism: Secondary | ICD-10-CM

## 2013-12-15 DIAGNOSIS — N318 Other neuromuscular dysfunction of bladder: Secondary | ICD-10-CM

## 2013-12-15 DIAGNOSIS — E785 Hyperlipidemia, unspecified: Secondary | ICD-10-CM

## 2013-12-15 DIAGNOSIS — N3281 Overactive bladder: Secondary | ICD-10-CM

## 2013-12-15 DIAGNOSIS — E119 Type 2 diabetes mellitus without complications: Secondary | ICD-10-CM

## 2013-12-15 LAB — POCT GLYCOSYLATED HEMOGLOBIN (HGB A1C): Hemoglobin A1C: 6.1

## 2013-12-15 MED ORDER — OXYBUTYNIN CHLORIDE 5 MG PO TABS: 5.0000 mg | ORAL_TABLET | Freq: Three times a day (TID) | ORAL | Status: AC

## 2013-12-15 MED ORDER — LEVOTHYROXINE SODIUM 100 MCG PO TABS: 100.0000 ug | ORAL_TABLET | Freq: Every day | ORAL | Status: AC

## 2013-12-15 MED ORDER — METFORMIN HCL 1000 MG PO TABS: 1000.0000 mg | ORAL_TABLET | Freq: Two times a day (BID) | ORAL | Status: DC

## 2013-12-15 NOTE — Patient Instructions (Signed)
1)  Hypothyroidism - Stay on the 100 mcg of Synthroid.  2)  Blood Sugar - Take 1 of the metformin 1000 mg per day.  3)  Overactive Bladder - Start with 1 tab as needed and increase to 3 if needed.    Overactive Bladder, Adult The bladder has two functions that are totally opposite of the other. One is to relax and stretch out so it can store urine (fills like a balloon), and the other is to contract and squeeze down so that it can empty the urine that it has stored. Proper functioning of the bladder is a complex mixing of these two functions. The filling and emptying of the bladder can be influenced by:  The bladder.  The spinal cord.  The brain.  The nerves going to the bladder.  Other organs that are closely related to the bladder such as prostate in males and the vagina in females. As your bladder fills with urine, nerve signals are sent from the bladder to the brain to tell you that you may need to urinate. Normal urination requires that the bladder squeeze down with sufficient strength to empty the bladder, but this also requires that the bladder squeeze down sufficiently long to finish the job. In addition the sphincter muscles, which normally keep you from leaking urine, must also relax so that the urine can pass. Coordination between the bladder muscle squeezing down and the sphincter muscles relaxing is required to make everything happen normally. With an overactive bladder sometimes the muscles of the bladder contract unexpectedly and involuntarily and this causes an urgent need to urinate. The normal response is to try to hold urine in by contracting the sphincter muscles. Sometimes the bladder contracts so strongly that the sphincter muscles cannot stop the urine from passing out and incontinence occurs. This kind of incontinence is called urge incontinence. Having an overactive bladder can be embarrassing and awkward. It can keep you from living life the way you want to. Many people  think it is just something you have to put up with as you grow older or have certain health conditions. In fact, there are treatments that can help make your life easier and more pleasant. CAUSES  Many things can cause an overactive bladder. Possibilities include:  Urinary tract infection or infection of nearby tissues such as the prostate.  Prostate enlargement.  In women, multiple pregnancies or surgery on the uterus or urethra.  Bladder stones, inflammation or tumors.  Caffeine.  Alcohol.  Medications. For example, diuretics (drugs that help the body get rid of extra fluid) increase urine production. Some other medicines must be taken with lots of fluids.  Muscle or nerve weakness. This might be the result of a spinal cord injury, a stroke, multiple sclerosis or Parkinson's disease.  Diabetes can cause a high urine volume which fills the bladder so quickly that the normal urge to urinate is triggered very strongly. SYMPTOMS   Loss of bladder control. You feel the need to urinate and cannot make your body wait.  Sudden, strong urges to urinate.  Urinating 8 or more times a day.  Waking up to urinate two or more times a night. DIAGNOSIS  To decide if you have overactive bladder, your healthcare provider will probably:  Ask about symptoms you have noticed.  Ask about your overall health. This will include questions about any medications you are taking.  Do a physical examination. This will help determine if there are obvious blockages or other problems.  Order some tests. These might include:  A blood test to check for diabetes or other health issues that could be contributing to the problem.  Urine testing. This could measure the flow of urine and the pressure on the bladder.  A test of your neurological system (the brain, spinal cord and nerves). This is the system that senses the need to urinate. Some of these tests are called flow tests, bladder pressure tests and  electrical measurements of the sphincter muscle.  A bladder test to check whether it is emptying completely when you urinate.  Cytoscopy. This test uses a thin tube with a tiny camera on it. It offers a look inside your urethra and bladder to see if there are problems.  Imaging tests. You might be given a contrast dye and then asked to urinate. X-rays are taken to see how your bladder is working. TREATMENT  An overactive bladder can be treated in many ways. The treatment will depend on the cause. Whether you have a mild or severe case also makes a difference. Often, treatment can be given in your healthcare provider's office or clinic. Be sure to discuss the different options with your caregiver. They include:  Behavioral treatments. These do not involve medication or surgery:  Bladder training. For this, you would follow a schedule to urinate at regular intervals. This helps you learn to control the urge to urinate. At first, you might be asked to wait a few minutes after feeling the urge. In time, you should be able to schedule bathroom visits an hour or more apart.  Kegel exercises. These exercises strengthen the pelvic floor muscles, which support the bladder. By toning these muscles, they can help control urination, even if the bladder muscles are overactive. A specialist will teach you how to do these exercises correctly. They will require daily practice.  Weight loss. If you are obese or overweight, losing weight might stop your bladder from being overactive. Talk to your healthcare provider about how many pounds you should lose. Also ask if there is a specific program or method that would work best for you.  Diet change. This might be suggested if constipation is making your overactive bladder worse. Your healthcare provider or a nutritionist can explain ways to change what you eat to ease constipation. Other people might need to take in less caffeine or alcohol. Sometimes drinking fewer  fluids is needed, too.  Protection. This is not an actual treatment. But, you could wear special pads to take care of any leakage while you wait for other treatments to take effect. This will help you avoid embarrassment.  Physical treatments.  Electrical stimulation. Electrodes will send gentle pulses to the nerves or muscles that help control the bladder. The goal is to strengthen them. Sometimes this is done with the electrodes outside of the body. Or, they might be placed inside the body (implanted). This treatment can take several months to have an effect.  Medications. These are usually used along with other treatments. Several medicines are available. Some are injected into the muscles involved in urination. Others come in pill form. Medications sometimes prescribed include:  Anticholinergics. These drugs block the signals that the nerves deliver to the bladder. This keeps it from releasing urine at the wrong time. Researchers think the drugs might help in other ways, too.  Imipramine. This is an antidepressant. But, it relaxes bladder muscles.  Botox. This is still experimental. Some people believe that injecting it into the bladder muscles will relax  them so they work more normally. It has also been injected into the sphincter muscle when the sphincter muscle does not open properly. This is a temporary fix, however. Also, it might make matters worse, especially in older people.  Surgery.  A device might be implanted to help manage your nerves. It works on the nerves that signal when you need to urinate.  Surgery is sometimes needed with electrical stimulation. If the electrodes are implanted, this is done through surgery.  Sometimes repairs need to be made through surgery. For example, the size of the bladder can be changed. This is usually done in severe cases only. HOME CARE INSTRUCTIONS   Take any medications your healthcare provider prescribed or suggested. Follow the directions  carefully.  Practice any lifestyle changes that are recommended. These might include:  Drinking less fluid or drinking at different times of the day. If you need to urinate often during the night, for example, you may need to stop drinking fluids early in the evening.  Cutting down on caffeine or alcohol. They can both make an overactive bladder worse. Caffeine is found in coffee, tea and sodas.  Doing Kegel exercises to strengthen muscles.  Losing weight, if that is recommended.  Eating a healthy and balanced diet. This will help you avoid constipation.  Keep a journal or a log. You might be asked to record how much you drink and when, and also when you feel the need to urinate.  Learn how to care for implants or other devices, such as pessaries. SEEK MEDICAL CARE IF:   Your overactive bladder gets worse.  You feel increased pain or irritation when you urinate.  You notice blood in your urine.  You have questions about any medications or devices that your healthcare provider recommended.  You notice blood, pus or swelling at the site of any test or treatment procedure.  You have an oral temperature above 102 F (38.9 C). SEEK IMMEDIATE MEDICAL CARE IF:  You have an oral temperature above 102 F (38.9 C), not controlled by medicine. Document Released: 03/17/2009 Document Revised: 08/13/2011 Document Reviewed: 03/17/2009 [facility] Patient Information 2015 [place], [place]. This information is not intended to replace advice given to you by your health care provider. Make sure you discuss any questions you have with your health care provider.

## 2013-12-15 NOTE — Progress Notes (Signed)
   Subjective:    Patient ID: Elaine Anderson, female    DOB: [birth year], 73 y.o.   MRN: [id number]  HPI Elaine Anderson is here today to go over her recent lab results. We are going over the following issues:  1)  Hyperlipidemia:  She is doing well on the simvastatin. She is needing refills.  2)  Type II DM:  She is doing well on the metformin. Her A1c is 6.1.   3)  Hypothyroidism:  She is doing well on the Synthroid 162mcg. She needs a refill.  4)  OAB:  Her insurance is not wanting to pay for the medications she currently was written. She would like to change to Oxybutynin.    Review of Systems  Constitutional: Negative for activity change, appetite change and fatigue.  Cardiovascular: Negative for chest pain, palpitations and leg swelling.  Endocrine: Negative for polydipsia, polyphagia and polyuria.  Psychiatric/Behavioral: Negative for behavioral problems. The patient is not nervous/anxious.   All other systems reviewed and are negative.      Objective:   Physical Exam  Nursing note and vitals reviewed. Constitutional: She appears well-nourished. No distress.  HENT:  Head: Normocephalic.  Eyes: No scleral icterus.  Neck: No thyromegaly present.  Cardiovascular: Normal rate, regular rhythm and normal heart sounds.   Pulmonary/Chest: Effort normal and breath sounds normal.  Abdominal: There is no tenderness.  Musculoskeletal: She exhibits no edema and no tenderness.  Neurological: She is alert.  Skin: Skin is warm and dry.  Psychiatric: She has a normal mood and affect. Her behavior is normal. Judgment and thought content normal.       Assessment & Plan:    [place] was seen today for medication management.  Diagnoses and associated orders for this visit:  Other specified acquired hypothyroidism - levothyroxine (SYNTHROID, LEVOTHROID) 100 MCG tablet; Take 1 tablet (100 mcg total) by mouth daily before breakfast.  Other and unspecified hyperlipidemia  OAB (overactive  bladder) - oxybutynin (DITROPAN) 5 MG tablet; Take 1 tablet (5 mg total) by mouth 3 (three) times daily.  Type II or unspecified type diabetes mellitus without mention of complication, not stated as uncontrolled - POCT HgB A1C - metFORMIN (GLUCOPHAGE) 1000 MG tablet; Take 1 tablet (1,000 mg total) by mouth 2 (two) times daily with a meal.   TIME SPENT "FACE TO FACE" WITH PATIENT -  30 MINS

## 2014-02-19 ENCOUNTER — Other Ambulatory Visit (HOSPITAL_BASED_OUTPATIENT_CLINIC_OR_DEPARTMENT_OTHER): Payer: Self-pay | Admitting: Family Medicine

## 2014-02-19 DIAGNOSIS — Z1231 Encounter for screening mammogram for malignant neoplasm of breast: Secondary | ICD-10-CM

## 2014-03-01 ENCOUNTER — Ambulatory Visit (HOSPITAL_BASED_OUTPATIENT_CLINIC_OR_DEPARTMENT_OTHER)
Admission: RE | Admit: 2014-03-01 | Discharge: 2014-03-01 | Disposition: A | Discharge status: Home / Self Care | Payer: Medicare Other | Source: Ambulatory Visit | Attending: Family Medicine | Admitting: Family Medicine

## 2014-03-01 DIAGNOSIS — Z1231 Encounter for screening mammogram for malignant neoplasm of breast: Secondary | ICD-10-CM | POA: Diagnosis not present

## 2017-12-21 ENCOUNTER — Encounter (HOSPITAL_BASED_OUTPATIENT_CLINIC_OR_DEPARTMENT_OTHER): Payer: Self-pay | Admitting: *Deleted

## 2017-12-21 ENCOUNTER — Other Ambulatory Visit: Payer: Self-pay

## 2017-12-21 ENCOUNTER — Emergency Department (HOSPITAL_BASED_OUTPATIENT_CLINIC_OR_DEPARTMENT_OTHER)
Admission: EM | Admit: 2017-12-21 | Discharge: 2017-12-21 | Disposition: A | Discharge status: Home / Self Care | Payer: Medicare Other | Attending: Emergency Medicine | Admitting: Emergency Medicine

## 2017-12-21 DIAGNOSIS — Z853 Personal history of malignant neoplasm of breast: Secondary | ICD-10-CM | POA: Insufficient documentation

## 2017-12-21 DIAGNOSIS — L089 Local infection of the skin and subcutaneous tissue, unspecified: Secondary | ICD-10-CM | POA: Diagnosis not present

## 2017-12-21 DIAGNOSIS — E119 Type 2 diabetes mellitus without complications: Secondary | ICD-10-CM | POA: Diagnosis not present

## 2017-12-21 DIAGNOSIS — Y92481 Parking lot as the place of occurrence of the external cause: Secondary | ICD-10-CM | POA: Insufficient documentation

## 2017-12-21 DIAGNOSIS — Y998 Other external cause status: Secondary | ICD-10-CM | POA: Insufficient documentation

## 2017-12-21 DIAGNOSIS — W010XXA Fall on same level from slipping, tripping and stumbling without subsequent striking against object, initial encounter: Secondary | ICD-10-CM | POA: Diagnosis not present

## 2017-12-21 DIAGNOSIS — E039 Hypothyroidism, unspecified: Secondary | ICD-10-CM | POA: Diagnosis not present

## 2017-12-21 DIAGNOSIS — I10 Essential (primary) hypertension: Secondary | ICD-10-CM | POA: Insufficient documentation

## 2017-12-21 DIAGNOSIS — Z7984 Long term (current) use of oral hypoglycemic drugs: Secondary | ICD-10-CM | POA: Insufficient documentation

## 2017-12-21 DIAGNOSIS — Z85850 Personal history of malignant neoplasm of thyroid: Secondary | ICD-10-CM | POA: Insufficient documentation

## 2017-12-21 DIAGNOSIS — S59902A Unspecified injury of left elbow, initial encounter: Secondary | ICD-10-CM | POA: Diagnosis present

## 2017-12-21 DIAGNOSIS — Y9301 Activity, walking, marching and hiking: Secondary | ICD-10-CM | POA: Diagnosis not present

## 2017-12-21 DIAGNOSIS — Z23 Encounter for immunization: Secondary | ICD-10-CM | POA: Diagnosis not present

## 2017-12-21 DIAGNOSIS — S50312A Abrasion of left elbow, initial encounter: Secondary | ICD-10-CM | POA: Insufficient documentation

## 2017-12-21 MED ORDER — CEPHALEXIN 500 MG PO CAPS: 500.0000 mg | ORAL_CAPSULE | Freq: Four times a day (QID) | ORAL | 0 refills | Status: DC

## 2017-12-21 MED ORDER — TETANUS-DIPHTH-ACELL PERTUSSIS 5-2.5-18.5 LF-MCG/0.5 IM SUSP
0.5000 mL | Freq: Once | INTRAMUSCULAR | Status: AC
  Administered 2017-12-21: 0.5 mL via INTRAMUSCULAR
  Filled 2017-12-21: qty 0.5

## 2017-12-21 NOTE — ED Provider Notes (Signed)
[facility] EMERGENCY DEPARTMENT Provider Note   CSN: [id number] Arrival date & time: 12/21/17  0850     History   Chief Complaint Chief Complaint  Patient presents with  . Fall Arm injury    HPI Gene Duva is a 77 y.o. female.  Patient is a 77-year-old female presenting today after a mechanical fall on Wednesday.  Patient was walking through the parking lot and tripped on a parking block causing her to fall forward landing on her left knee and scraping her left elbow.  Patient has a prior history of thyroid cancer and lymph node removal on the left side and family is concerned that she may be developing an infection of her arm.  She has a large abrasion over the left elbow and states that starting yesterday it started to become red, warm and more swollen.  She denies any drainage from the wound and denies worsening pain.  She has not had fever.  However she states several years ago she had a splinter in because she has no lymph nodes on that side it developed into cellulitis requiring a 2-day hospital stay with IV antibiotics and she wanted to avoid that at this time.  The history is provided by the patient and the spouse.    Past Medical History:  Diagnosis Date  . Anxiety   . Breast cancer 1991  . Diabetes type 2, controlled   . Hyperlipidemia   . Hypertension   . Macular disorder    macular bubble   . Other disorders of ear(388.8)   . Post-surgical hypothyroidism   . Rash and other nonspecific skin eruption   . Thyroid cancer 2007    Patient Active Problem List   Diagnosis Date Noted  . Type II or unspecified type diabetes mellitus without mention of complication, not stated as uncontrolled 09/10/2013  . Anxiety state, unspecified 09/10/2013  . OAB (overactive bladder) 09/10/2013  . Otalgia 05/03/2013  . Impacted cerumen of right ear 05/03/2013  . Essential hypertension, benign 05/03/2013  . Type II or unspecified type diabetes mellitus without mention  of complication, uncontrolled 12/23/2012  . Other and unspecified hyperlipidemia 12/23/2012  . Unspecified hypothyroidism 12/23/2012  . Irritable mood 12/23/2012  . Overactive bladder 12/23/2012  . Personal history of malignant neoplasm of breast 12/23/2012  . Breast screening, unspecified 12/23/2012  . Post-surgical hypothyroidism   . Diabetes type 2, controlled (HCC)   . Breast cancer (HCC)     Past Surgical History:  Procedure Laterality Date  . ABDOMINAL HYSTERECTOMY     Menorrhagia/Dysmenorrhea  . MASTECTOMY Left   . OOPHORECTOMY     Side unspecified   . THYROIDECTOMY       OB History   None      Home Medications    Prior to Admission medications   Medication Sig Start Date End Date Taking? Authorizing Provider  ACCU-CHEK SOFTCLIX LANCETS lancets  08/24/13   [provider]  Blood Glucose Monitoring Suppl (ACCU-CHEK AVIVA PLUS) W/DEVICE KIT  06/15/13   [provider]  glucose blood test strip Use as instructed 12/24/12   Zanard, Robyn K, MD  metFORMIN (GLUCOPHAGE) 1000 MG tablet Take 1 tablet (1,000 mg total) by mouth 2 (two) times daily with a meal. 12/15/13 12/16/14  Zanard, Robyn K, MD  metFORMIN (GLUCOPHAGE) 500 MG tablet Take 1 tablet (500 mg total) by mouth 2 (two) times daily with a meal. 09/10/13 09/10/14  Zanard, Robyn K, MD  simvastatin (ZOCOR) 5 MG tablet Take   1 tablet (5 mg total) by mouth at bedtime. 09/10/13 09/10/14  Jonathon Resides, MD    Family History No family history on file.  Social History Social History   Tobacco Use  . Smoking status: Never Smoker  . Smokeless tobacco: Never Used  Substance Use Topics  . Alcohol use: No  . Drug use: No     Allergies   Patient has no known allergies.   Review of Systems Review of Systems  All other systems reviewed and are negative.    Physical Exam Updated Vital Signs BP (!) 142/83 (BP Location: Left Arm)   Pulse 78   Temp 98.3 F (36.8 C) (Oral)   Resp 16   Ht 5' 2" (1.575 m)    Wt 54.4 kg (120 lb)   SpO2 98%   BMI 21.95 kg/m   Physical Exam  Constitutional: She is oriented to person, place, and time. She appears well-developed and well-nourished. No distress.  HENT:  Head: Normocephalic and atraumatic.  Mouth/Throat: Oropharynx is clear and moist.  Eyes: Pupils are equal, round, and reactive to light. Conjunctivae and EOM are normal.  Neck: Normal range of motion. Neck supple.  Cardiovascular: Normal rate and intact distal pulses.  Pulmonary/Chest: Effort normal.  Musculoskeletal: Normal range of motion. She exhibits no edema or tenderness.       Arms: Neurological: She is alert and oriented to person, place, and time.  Skin: Skin is warm and dry. No rash noted. No erythema.  Psychiatric: She has a normal mood and affect. Her behavior is normal.  Nursing note and vitals reviewed.    ED Treatments / Results  Labs (all labs ordered are listed, but only abnormal results are displayed) Labs Reviewed - No data to display  EKG None  Radiology No results found.  Procedures Procedures (including critical care time)  Medications Ordered in ED Medications  Tdap (BOOSTRIX) injection 0.5 mL (has no administration in time range)     Initial Impression / Assessment and Plan / ED Course  I have reviewed the triage vital signs and the nursing notes.  Pertinent labs & imaging results that were available during my care of the patient were reviewed by me and considered in my medical decision making (see chart for details).     Patient presenting today because of a large abrasion over her elbow and some new erythema.  Patient has had lymph node dissection on that side and has a predisposition to get infections easily on that arm.  She does have some mild erythema, warmth and swelling around the abrasion but no drainage or signs concerning for abscess.  Patient has no systemic symptoms.  Will treat with Keflex.  Patient also had her tetanus shot  updated.  Final Clinical Impressions(s) / ED Diagnoses   Final diagnoses:  Elbow abrasion, infected, left, initial encounter    ED Discharge Orders        Ordered    cephALEXin (KEFLEX) 500 MG capsule  4 times daily     12/21/17 0913       Blanchie Dessert, MD 12/21/17 [phone]

## 2017-12-21 NOTE — ED Triage Notes (Addendum)
[place] on the concrete last Wednesday after,  abrasion to left arm. Concerned of Staph infection d/t history.

## 2017-12-21 NOTE — ED Notes (Signed)
Abrasion to left posterior forearm measures 9 x  1 cm;  Abrasion to left knee measures 3 x 1 cm.

## 2017-12-21 NOTE — ED Notes (Signed)
ED Provider at bedside. 

## 2019-07-01 ENCOUNTER — Ambulatory Visit: Payer: Self-pay

## 2019-07-10 ENCOUNTER — Ambulatory Visit: Payer: Medicare Other | Attending: Internal Medicine

## 2019-07-10 DIAGNOSIS — Z23 Encounter for immunization: Secondary | ICD-10-CM | POA: Insufficient documentation

## 2019-07-10 NOTE — Progress Notes (Signed)
   Covid-19 Vaccination Clinic  Name:  Denisa Lemaitre    MRN: [id number] DOB: 1940/08/23  07/10/2019  Ms. Neupert was observed post Covid-19 immunization for 15 minutes without incidence. She was provided with Vaccine Information Sheet and instruction to access the V-Safe system.   Ms. Marotte was instructed to call 911 with any severe reactions post vaccine: Marland Kitchen Difficulty breathing  . Swelling of your face and throat  . A fast heartbeat  . A bad rash all over your body  . Dizziness and weakness    Immunizations Administered    Name Date Dose VIS Date Route   Pfizer COVID-19 Vaccine 07/10/2019  9:17 AM 0.3 mL 05/15/2019 Intramuscular   Manufacturer: [place]   Lot: [id number]   [place]: [id number]

## 2019-07-18 ENCOUNTER — Ambulatory Visit: Payer: Self-pay

## 2019-08-03 ENCOUNTER — Ambulatory Visit: Payer: Medicare Other | Attending: Internal Medicine

## 2019-08-03 DIAGNOSIS — Z23 Encounter for immunization: Secondary | ICD-10-CM | POA: Insufficient documentation

## 2019-08-03 NOTE — Progress Notes (Signed)
   Covid-19 Vaccination Clinic  Name:  Starlette Cohea    MRN: [id number] DOB: 1940/12/01  08/03/2019  Ms. Munson was observed post Covid-19 immunization for 15 minutes without incidence. She was provided with Vaccine Information Sheet and instruction to access the V-Safe system.   Ms. Clure was instructed to call 911 with any severe reactions post vaccine: Marland Kitchen Difficulty breathing  . Swelling of your face and throat  . A fast heartbeat  . A bad rash all over your body  . Dizziness and weakness    Immunizations Administered    Name Date Dose VIS Date Route   Pfizer COVID-19 Vaccine 08/03/2019  1:35 PM 0.3 mL 05/15/2019 Intramuscular   Manufacturer: [place]   Lot: [id number]   [place]: [id number]

## 2020-11-14 ENCOUNTER — Encounter (HOSPITAL_BASED_OUTPATIENT_CLINIC_OR_DEPARTMENT_OTHER): Payer: Self-pay | Admitting: *Deleted

## 2020-11-14 ENCOUNTER — Emergency Department (HOSPITAL_BASED_OUTPATIENT_CLINIC_OR_DEPARTMENT_OTHER)
Admission: EM | Admit: 2020-11-14 | Discharge: 2020-11-14 | Disposition: A | Discharge status: Home / Self Care | Payer: Medicare Other | Attending: Emergency Medicine | Admitting: Emergency Medicine

## 2020-11-14 ENCOUNTER — Emergency Department (HOSPITAL_BASED_OUTPATIENT_CLINIC_OR_DEPARTMENT_OTHER): Payer: Medicare Other

## 2020-11-14 ENCOUNTER — Other Ambulatory Visit: Payer: Self-pay

## 2020-11-14 DIAGNOSIS — Z7984 Long term (current) use of oral hypoglycemic drugs: Secondary | ICD-10-CM | POA: Insufficient documentation

## 2020-11-14 DIAGNOSIS — Z79899 Other long term (current) drug therapy: Secondary | ICD-10-CM | POA: Insufficient documentation

## 2020-11-14 DIAGNOSIS — Z85850 Personal history of malignant neoplasm of thyroid: Secondary | ICD-10-CM | POA: Diagnosis not present

## 2020-11-14 DIAGNOSIS — S93402A Sprain of unspecified ligament of left ankle, initial encounter: Secondary | ICD-10-CM | POA: Diagnosis not present

## 2020-11-14 DIAGNOSIS — I10 Essential (primary) hypertension: Secondary | ICD-10-CM | POA: Diagnosis not present

## 2020-11-14 DIAGNOSIS — E785 Hyperlipidemia, unspecified: Secondary | ICD-10-CM | POA: Insufficient documentation

## 2020-11-14 DIAGNOSIS — E1169 Type 2 diabetes mellitus with other specified complication: Secondary | ICD-10-CM | POA: Diagnosis not present

## 2020-11-14 DIAGNOSIS — S99912A Unspecified injury of left ankle, initial encounter: Secondary | ICD-10-CM | POA: Diagnosis present

## 2020-11-14 DIAGNOSIS — E039 Hypothyroidism, unspecified: Secondary | ICD-10-CM | POA: Insufficient documentation

## 2020-11-14 DIAGNOSIS — X501XXA Overexertion from prolonged static or awkward postures, initial encounter: Secondary | ICD-10-CM | POA: Insufficient documentation

## 2020-11-14 DIAGNOSIS — Z853 Personal history of malignant neoplasm of breast: Secondary | ICD-10-CM | POA: Diagnosis not present

## 2020-11-14 NOTE — ED Provider Notes (Signed)
[facility] EMERGENCY DEPARTMENT Provider Note   CSN: [id number] Arrival date & time: 11/14/20  1042     History Chief Complaint  Patient presents with   Ankle Injury    Elaine Anderson is a 80 y.o. female.  HPI Patient is a 80 year old female with a medical history as noted below.  About 2 weeks ago she stubbed her toe and inverted her left ankle.  She noted mild pain and swelling to the lateral aspect of the ankle which had mostly improved.  Over the past few days she has been ambulating significantly throughout the day and then began having worsening pain and swelling in the same region of the ankle.  No numbness or weakness.  She decided to come to the emergency department for x-rays to rule out fracture.  No other complaints.    Past Medical History:  Diagnosis Date   Anxiety    Breast cancer ([place]) 1991   Diabetes type 2, controlled ([place])    Hyperlipidemia    Hypertension    Macular disorder    macular bubble    Other disorders of ear(388.8)    Post-surgical hypothyroidism    Rash and other nonspecific skin eruption    Thyroid cancer ([place]) 2007    Patient Active Problem List   Diagnosis Date Noted   Type II or unspecified type diabetes mellitus without mention of complication, not stated as uncontrolled 09/10/2013   Anxiety state, unspecified 09/10/2013   OAB (overactive bladder) 09/10/2013   Otalgia 05/03/2013   Impacted cerumen of right ear 05/03/2013   Essential hypertension, benign 05/03/2013   Type II or unspecified type diabetes mellitus without mention of complication, uncontrolled 12/23/2012   Other and unspecified hyperlipidemia 12/23/2012   Unspecified hypothyroidism 12/23/2012   Irritable mood 12/23/2012   Overactive bladder 12/23/2012   Personal history of malignant neoplasm of breast 12/23/2012   Breast screening, unspecified 12/23/2012   Post-surgical hypothyroidism    Diabetes type 2, controlled ([place])    Breast cancer ([place])     Past  Surgical History:  Procedure Laterality Date   ABDOMINAL HYSTERECTOMY     Menorrhagia/Dysmenorrhea   MASTECTOMY Left    OOPHORECTOMY     Side unspecified    THYROIDECTOMY       OB History     Gravida  1   Para  1   Term      Preterm      AB      Living         SAB      IAB      Ectopic      Multiple      Live Births              Family History  Problem Relation Age of Onset   Tuberculosis Mother    Cancer Father     Social History   Tobacco Use   Smoking status: Never   Smokeless tobacco: Never  Vaping Use   Vaping Use: Never used  Substance Use Topics   Alcohol use: No   Drug use: No    Home Medications Prior to Admission medications   Medication Sig Start Date End Date Taking? Authorizing Provider  escitalopram (LEXAPRO) 5 MG tablet Take by mouth. 12/03/16 07/18/21 Yes [provider]  levothyroxine (SYNTHROID) 75 MCG tablet Take by mouth. 07/18/20  Yes [provider]  losartan (COZAAR) 50 MG tablet Take by mouth. 01/13/20  Yes [provider]  metFORMIN (  GLUCOPHAGE) 1000 MG tablet Take by mouth. 07/18/20 07/18/21 Yes [provider]  oxybutynin (DITROPAN) 5 MG tablet Take by mouth. 01/13/20 01/13/21 Yes [provider]  rosuvastatin (CRESTOR) 5 MG tablet Take by mouth. 12/03/16  Yes [provider]  ACCU-CHEK SOFTCLIX LANCETS lancets  08/24/13   [provider]  Blood Glucose Monitoring Suppl (ACCU-CHEK AVIVA PLUS) W/DEVICE KIT  06/15/13   [provider]  cephALEXin (KEFLEX) 500 MG capsule Take 1 capsule (500 mg total) by mouth 4 (four) times daily. 12/21/17   Blanchie Dessert, MD  glucose blood test strip Use as instructed 12/24/12   Zanard, Bernadene Bell, MD  metFORMIN (GLUCOPHAGE) 1000 MG tablet Take 1 tablet (1,000 mg total) by mouth 2 (two) times daily with a meal. 12/15/13 12/16/14  Zanard, Bernadene Bell, MD  metFORMIN (GLUCOPHAGE) 500 MG tablet Take 1 tablet (500 mg total) by mouth 2 (two)  times daily with a meal. 09/10/13 12/21/17  Zanard, Bernadene Bell, MD  simvastatin (ZOCOR) 5 MG tablet Take 1 tablet (5 mg total) by mouth at bedtime. 09/10/13 12/21/17  Jonathon Resides, MD    Allergies    Patient has no known allergies.  Review of Systems   Review of Systems  Musculoskeletal:  Positive for arthralgias and joint swelling.  Neurological:  Negative for weakness and numbness.   Physical Exam Updated Vital Signs BP 131/69 (BP Location: Right Arm)   Pulse 77   Temp 98.5 F (36.9 C) (Oral)   Resp 16   Ht _0  (1.575 m)   Wt 54.4 kg   SpO2 98%   BMI 21.95 kg/m   Physical Exam Vitals and nursing note reviewed.  Constitutional:      General: She is not in acute distress.    Appearance: She is well-developed.  HENT:     Head: Normocephalic and atraumatic.     Right Ear: External ear normal.     Left Ear: External ear normal.  Eyes:     General: No scleral icterus.       Right eye: No discharge.        Left eye: No discharge.     Conjunctiva/sclera: Conjunctivae normal.  Neck:     Trachea: No tracheal deviation.  Cardiovascular:     Rate and Rhythm: Normal rate.  Pulmonary:     Effort: Pulmonary effort is normal. No respiratory distress.     Breath sounds: No stridor.  Abdominal:     General: There is no distension.  Musculoskeletal:        General: Swelling and tenderness present. No deformity.     Cervical back: Neck supple.     Comments: Left ankle: mild edema noted to the lateral malleolus of the left ankle.  Very mild tenderness noted just inferior to the lateral malleolus of the ankle.  Full range of motion of the joint.  Distal sensation intact.  2+ DP pulses.  Good cap refill.  Skin:    General: Skin is warm and dry.     Findings: No rash.  Neurological:     Mental Status: She is alert.     Cranial Nerves: Cranial nerve deficit: no gross deficits.    ED Results / Procedures / Treatments   Labs (all labs ordered are listed, but only abnormal results are  displayed) Labs Reviewed - No data to display  EKG None  Radiology DG Ankle Complete Left  Result Date: 11/14/2020 CLINICAL DATA:  [address] [address] 2 weeks ago with medial pain  EXAM: LEFT ANKLE COMPLETE - 3+ VIEW COMPARISON:  None. FINDINGS: There is no evidence of fracture, dislocation, or joint effusion. There is no evidence of arthropathy or other focal bone abnormality. Soft tissues are unremarkable. IMPRESSION: Negative. Electronically Signed   By: Nelson Chimes M.D.   On: 11/14/2020 11:27    Procedures Procedures   Medications Ordered in ED Medications - No data to display  ED Course  I have reviewed the triage vital signs and the nursing notes.  Pertinent labs & imaging results that were available during my care of the patient were reviewed by me and considered in my medical decision making (see chart for details).    MDM Rules/Calculators/A&P                          Patient is a 80 year old female who presents to the emergency department due to left ankle pain after inverting it about 2 weeks ago.  Her symptoms had mostly resolved but after ambulating frequently for the past few days they began to worsen once again.  Physical exam is reassuring.  She has very mild tenderness and swelling overlying the lateral malleolus but otherwise is neurovascularly intact in the ankle and distal to the injury.  Full range of motion of the joint.  Ambulatory.  Patient provided a lace up ankle brace for comfort.  Recommended continued use of Tylenol and ibuprofen.  She was given a referral to sports medicine if she finds that her symptoms are refractory.  Discussed return precautions.  Her questions were answered and she was amicable at the time of discharge.  Final Clinical Impression(s) / ED Diagnoses Final diagnoses:  Sprain of left ankle, unspecified ligament, initial encounter    Rx / DC Orders ED Discharge Orders     None        Rayna Sexton, PA-C 11/14/20 1214    Little, Wenda Overland, MD 11/14/20 1331

## 2020-11-14 NOTE — ED Triage Notes (Signed)
She twisted her left ankle 2 weeks ago. Swelling continues. She is ambulatory. Swelling noted. Xray ordered.

## 2020-11-14 NOTE — Discharge Instructions (Signed)
Please continue to take Tylenol and ibuprofen as needed for management of your symptoms.  Please follow the instructions on the bottle.  I would recommend wearing your ankle brace as needed for pain relief and support in the ankle.  Continue to ice and elevate the ankle to help with your swelling.  Below is the contact information for Dr. Raeford Razor.  He has a sports medicine doctor who is located in the same building as this emergency department.  Please follow-up with him in 1 week if you find your symptoms have not began to improve.  You can always return to the emergency department if your symptoms worsen.  It was a pleasure to meet you.

## 2022-10-14 IMAGING — DX DG ANKLE COMPLETE 3+V*L*
3 series · 3 of 3 positions shown · non-contrast
Comparison: None.

CLINICAL DATA: Fell 2 weeks ago with medial pain

EXAM:
LEFT ANKLE COMPLETE - 3+ VIEW

[ankle ap]
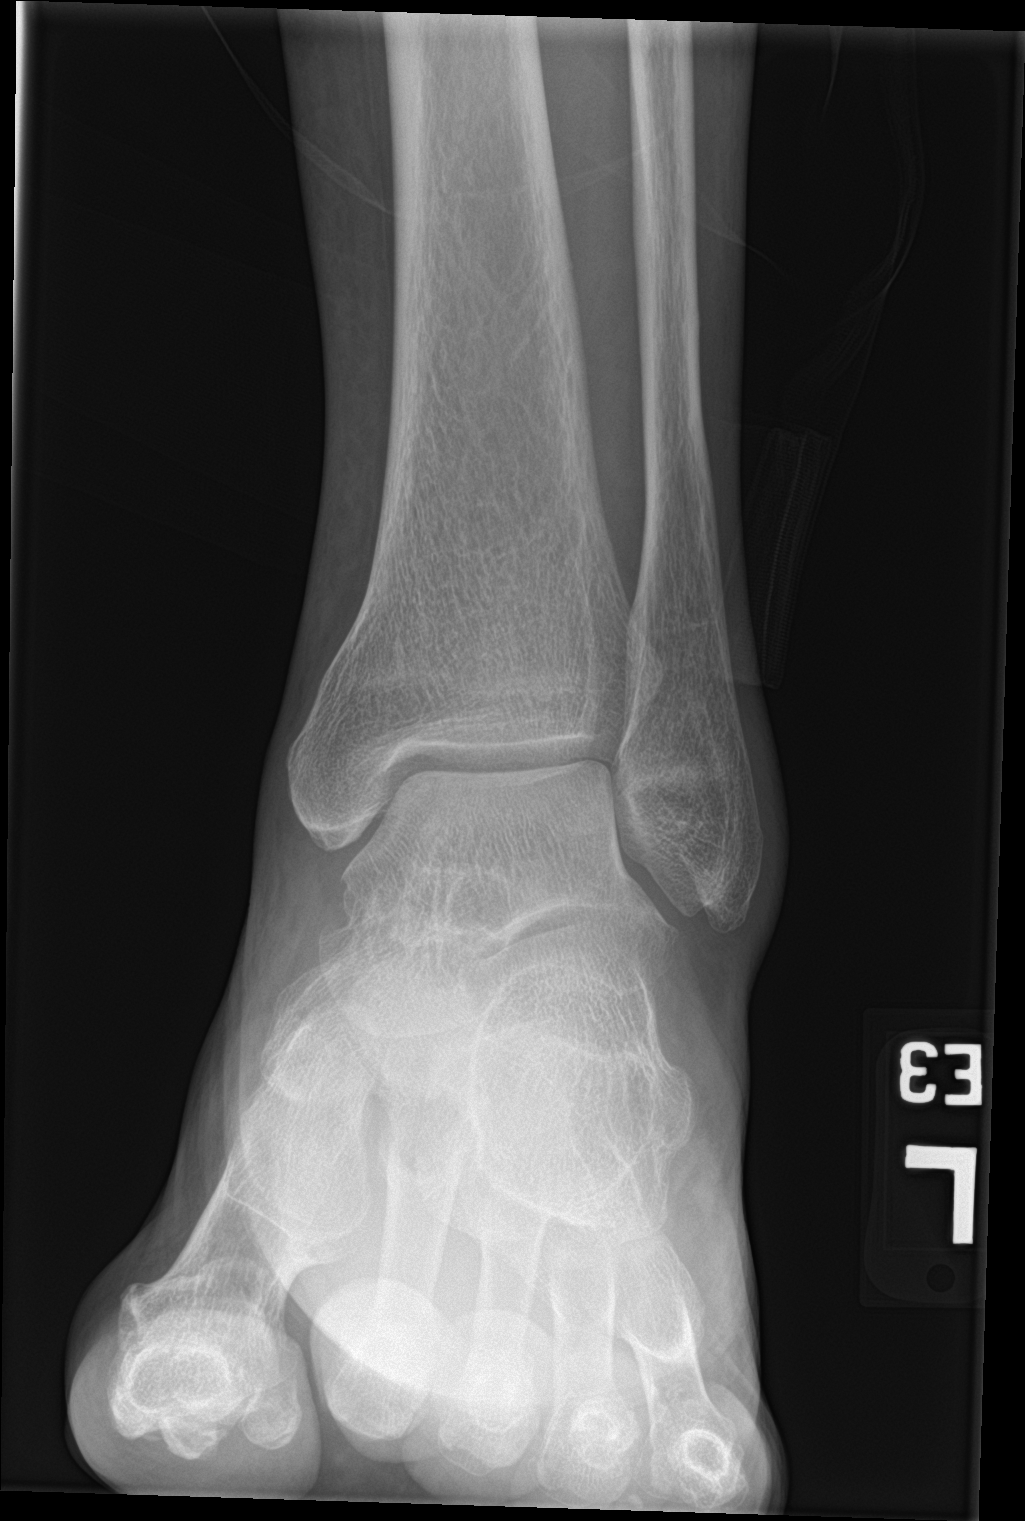

[ankle obl]
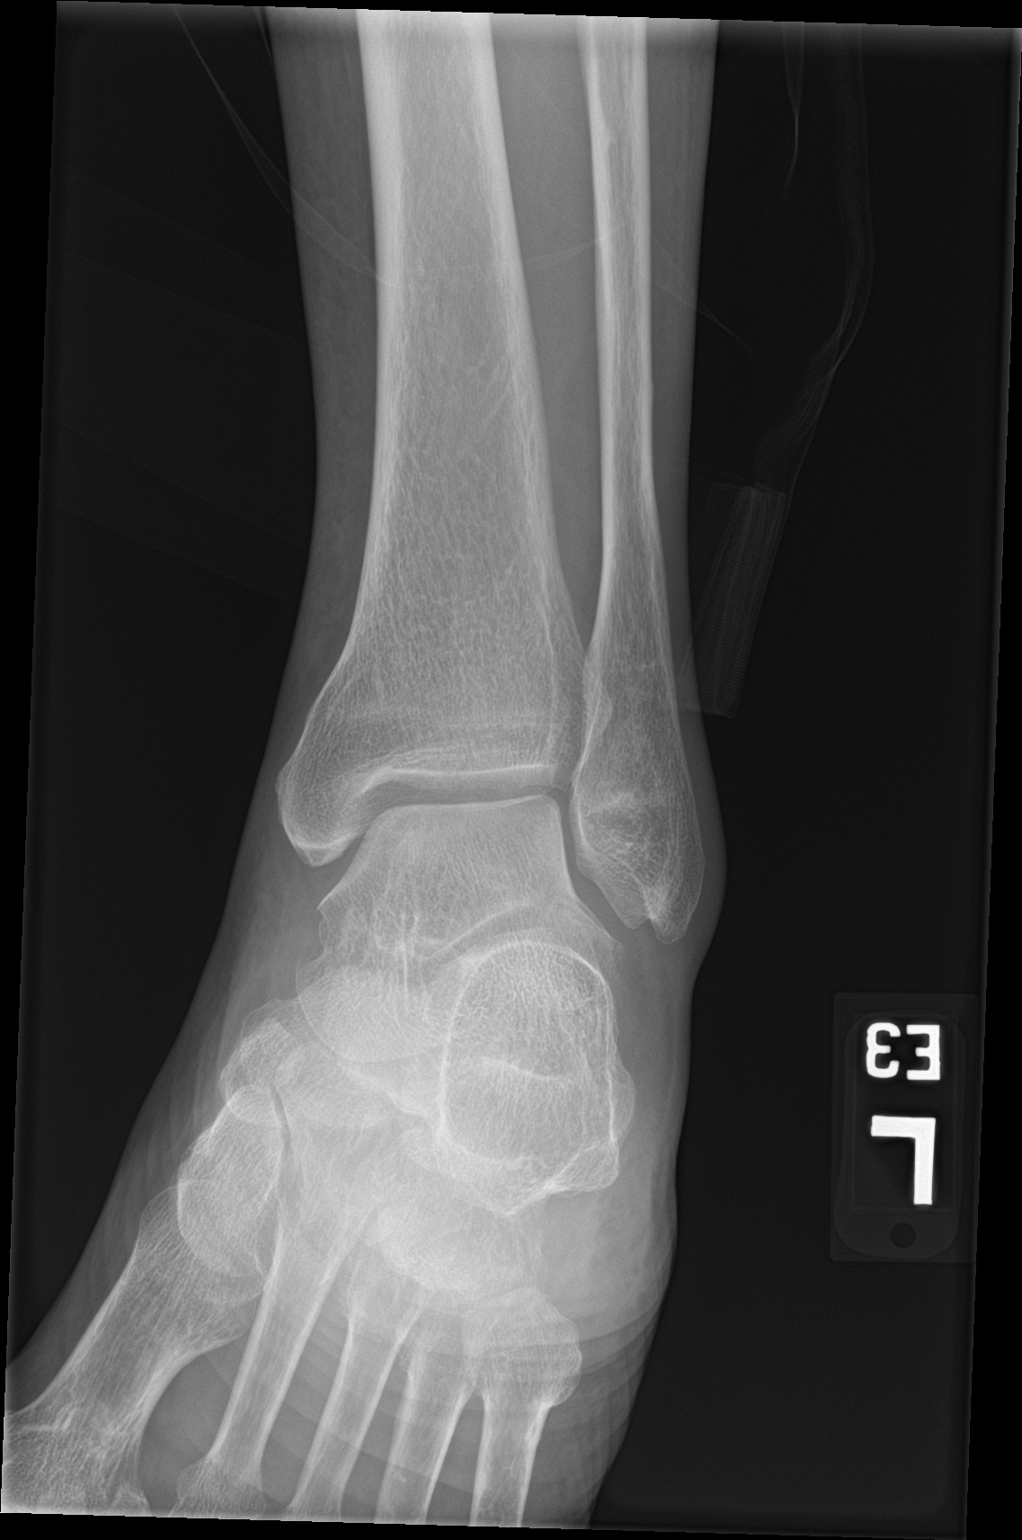

[ankle lat]
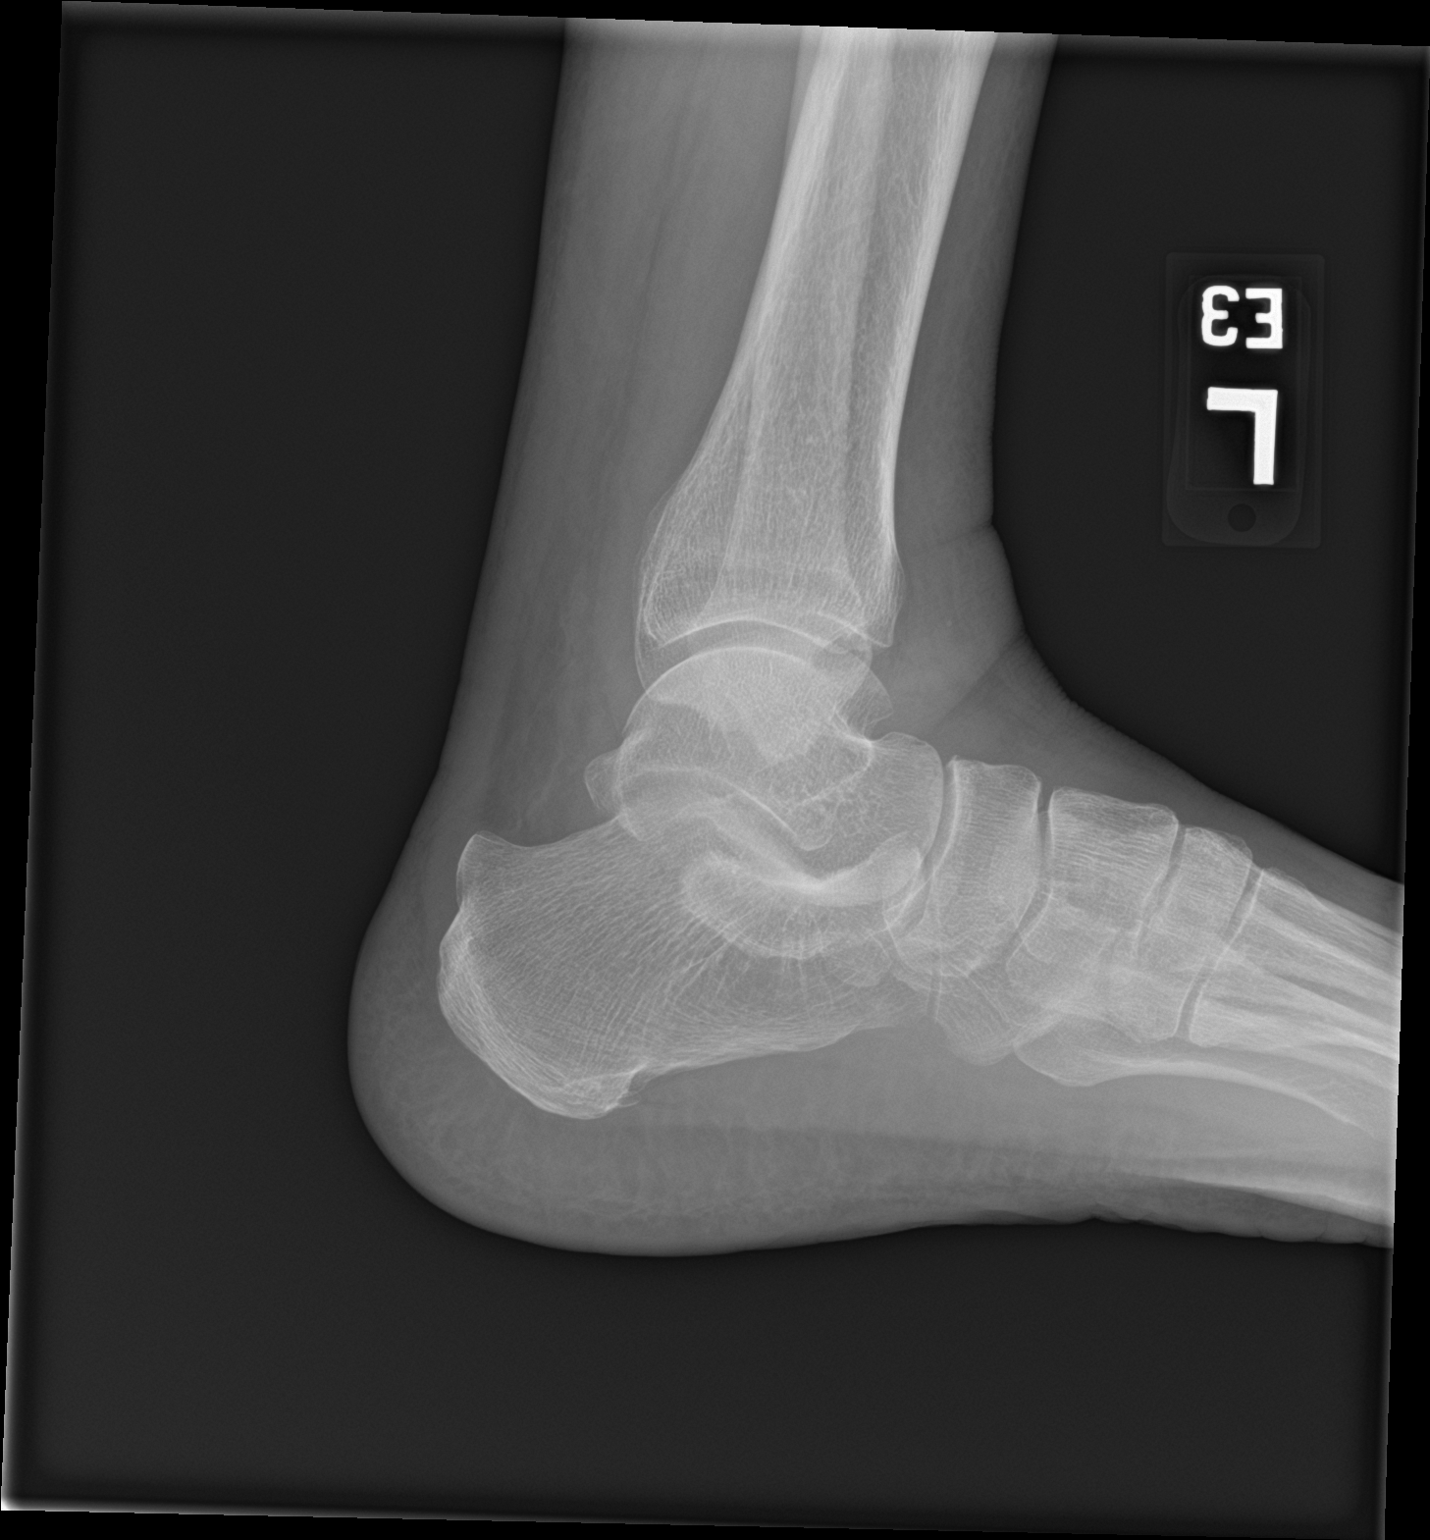

[3 of 3 positions shown; findings below may reference images not displayed]

FINDINGS: There is no evidence of fracture, dislocation, or joint effusion.
There is no evidence of arthropathy or other focal bone abnormality.
Soft tissues are unremarkable.
IMPRESSION: Negative.

## 2023-09-12 ENCOUNTER — Emergency Department (HOSPITAL_COMMUNITY)

## 2023-09-12 ENCOUNTER — Other Ambulatory Visit: Payer: Self-pay

## 2023-09-12 ENCOUNTER — Inpatient Hospital Stay (HOSPITAL_COMMUNITY)

## 2023-09-12 ENCOUNTER — Encounter (HOSPITAL_COMMUNITY): Payer: Self-pay

## 2023-09-12 ENCOUNTER — Observation Stay (HOSPITAL_COMMUNITY)
Admission: EM | Admit: 2023-09-12 | Discharge: 2023-09-13 | Disposition: A | Discharge status: Home / Self Care | Attending: Family Medicine | Admitting: Family Medicine

## 2023-09-12 DIAGNOSIS — R4182 Altered mental status, unspecified: Secondary | ICD-10-CM | POA: Diagnosis not present

## 2023-09-12 DIAGNOSIS — Z85850 Personal history of malignant neoplasm of thyroid: Secondary | ICD-10-CM | POA: Insufficient documentation

## 2023-09-12 DIAGNOSIS — E890 Postprocedural hypothyroidism: Secondary | ICD-10-CM | POA: Diagnosis not present

## 2023-09-12 DIAGNOSIS — Z853 Personal history of malignant neoplasm of breast: Secondary | ICD-10-CM

## 2023-09-12 DIAGNOSIS — R296 Repeated falls: Secondary | ICD-10-CM | POA: Insufficient documentation

## 2023-09-12 DIAGNOSIS — Z7984 Long term (current) use of oral hypoglycemic drugs: Secondary | ICD-10-CM | POA: Insufficient documentation

## 2023-09-12 DIAGNOSIS — I10 Essential (primary) hypertension: Secondary | ICD-10-CM | POA: Diagnosis not present

## 2023-09-12 DIAGNOSIS — H9209 Otalgia, unspecified ear: Secondary | ICD-10-CM

## 2023-09-12 DIAGNOSIS — R410 Disorientation, unspecified: Secondary | ICD-10-CM | POA: Diagnosis present

## 2023-09-12 DIAGNOSIS — N390 Urinary tract infection, site not specified: Secondary | ICD-10-CM | POA: Diagnosis not present

## 2023-09-12 DIAGNOSIS — R404 Transient alteration of awareness: Principal | ICD-10-CM

## 2023-09-12 DIAGNOSIS — E119 Type 2 diabetes mellitus without complications: Secondary | ICD-10-CM | POA: Diagnosis not present

## 2023-09-12 DIAGNOSIS — I959 Hypotension, unspecified: Secondary | ICD-10-CM | POA: Insufficient documentation

## 2023-09-12 DIAGNOSIS — H6121 Impacted cerumen, right ear: Secondary | ICD-10-CM

## 2023-09-12 DIAGNOSIS — R454 Irritability and anger: Secondary | ICD-10-CM

## 2023-09-12 DIAGNOSIS — Z1239 Encounter for other screening for malignant neoplasm of breast: Secondary | ICD-10-CM

## 2023-09-12 DIAGNOSIS — E785 Hyperlipidemia, unspecified: Secondary | ICD-10-CM | POA: Insufficient documentation

## 2023-09-12 DIAGNOSIS — N3281 Overactive bladder: Secondary | ICD-10-CM

## 2023-09-12 DIAGNOSIS — F411 Generalized anxiety disorder: Secondary | ICD-10-CM

## 2023-09-12 DIAGNOSIS — Z789 Other specified health status: Secondary | ICD-10-CM

## 2023-09-12 LAB — URINALYSIS, W/ REFLEX TO CULTURE (INFECTION SUSPECTED)
Bilirubin Urine: NEGATIVE
Glucose, UA: NEGATIVE mg/dL
Ketones, ur: NEGATIVE mg/dL
Nitrite: POSITIVE — AB
Protein, ur: NEGATIVE mg/dL
Specific Gravity, Urine: 1.016 (ref 1.005–1.030)
pH: 7 (ref 5.0–8.0)

## 2023-09-12 LAB — I-STAT CHEM 8, ED
BUN: 19 mg/dL (ref 8–23)
Calcium, Ion: 1.06 mmol/L — ABNORMAL LOW (ref 1.15–1.40)
Chloride: 103 mmol/L (ref 98–111)
Creatinine, Ser: 0.8 mg/dL (ref 0.44–1.00)
Glucose, Bld: 111 mg/dL — ABNORMAL HIGH (ref 70–99)
HCT: 34 % — ABNORMAL LOW (ref 36.0–46.0)
Hemoglobin: 11.6 g/dL — ABNORMAL LOW (ref 12.0–15.0)
Potassium: 4.1 mmol/L (ref 3.5–5.1)
Sodium: 136 mmol/L (ref 135–145)
TCO2: 25 mmol/L (ref 22–32)

## 2023-09-12 LAB — CBC WITH DIFFERENTIAL/PLATELET
Abs Immature Granulocytes: 0.05 10*3/uL (ref 0.00–0.07)
Basophils Absolute: 0 10*3/uL (ref 0.0–0.1)
Basophils Relative: 0 %
Eosinophils Absolute: 0 10*3/uL (ref 0.0–0.5)
Eosinophils Relative: 0 %
HCT: 34.3 % — ABNORMAL LOW (ref 36.0–46.0)
Hemoglobin: 11.2 g/dL — ABNORMAL LOW (ref 12.0–15.0)
Immature Granulocytes: 0 %
Lymphocytes Relative: 4 %
Lymphs Abs: 0.6 10*3/uL — ABNORMAL LOW (ref 0.7–4.0)
MCH: 29.8 pg (ref 26.0–34.0)
MCHC: 32.7 g/dL (ref 30.0–36.0)
MCV: 91.2 fL (ref 80.0–100.0)
Monocytes Absolute: 0.7 10*3/uL (ref 0.1–1.0)
Monocytes Relative: 5 %
Neutro Abs: 12.9 10*3/uL — ABNORMAL HIGH (ref 1.7–7.7)
Neutrophils Relative %: 91 %
Platelets: 151 10*3/uL (ref 150–400)
RBC: 3.76 MIL/uL — ABNORMAL LOW (ref 3.87–5.11)
RDW: 13.3 % (ref 11.5–15.5)
WBC: 14.2 10*3/uL — ABNORMAL HIGH (ref 4.0–10.5)
nRBC: 0 % (ref 0.0–0.2)

## 2023-09-12 LAB — AMMONIA: Ammonia: <10 umol/L (ref 9–35)

## 2023-09-12 LAB — COMPREHENSIVE METABOLIC PANEL WITH GFR
ALT: 13 U/L (ref 0–44)
AST: 20 U/L (ref 15–41)
Albumin: 3.3 g/dL — ABNORMAL LOW (ref 3.5–5.0)
Alkaline Phosphatase: 39 U/L (ref 38–126)
Anion gap: 9 (ref 5–15)
BUN: 16 mg/dL (ref 8–23)
CO2: 23 mmol/L (ref 22–32)
Calcium: 8.7 mg/dL — ABNORMAL LOW (ref 8.9–10.3)
Chloride: 103 mmol/L (ref 98–111)
Creatinine, Ser: 0.8 mg/dL (ref 0.44–1.00)
GFR, Estimated: >60 mL/min (ref >60)
Glucose, Bld: 118 mg/dL — ABNORMAL HIGH (ref 70–99)
Potassium: 3.9 mmol/L (ref 3.5–5.1)
Sodium: 135 mmol/L (ref 135–145)
Total Bilirubin: 0.7 mg/dL (ref 0.0–1.2)
Total Protein: 6.2 g/dL — ABNORMAL LOW (ref 6.5–8.1)

## 2023-09-12 LAB — HIV ANTIBODY (ROUTINE TESTING W REFLEX): HIV Screen 4th Generation wRfx: NONREACTIVE

## 2023-09-12 LAB — FOLATE: Folate: 39.7 ng/mL (ref >5.9)

## 2023-09-12 LAB — TROPONIN I (HIGH SENSITIVITY)
Troponin I (High Sensitivity): 5 ng/L (ref <18)
Troponin I (High Sensitivity): 5 ng/L (ref <18)

## 2023-09-12 LAB — VITAMIN B12: Vitamin B-12: 424 pg/mL (ref 180–914)

## 2023-09-12 LAB — TSH: TSH: 0.404 u[IU]/mL (ref 0.350–4.500)

## 2023-09-12 LAB — GLUCOSE, CAPILLARY: Glucose-Capillary: 95 mg/dL (ref 70–99)

## 2023-09-12 MED ORDER — ESCITALOPRAM OXALATE 10 MG PO TABS
5.0000 mg | ORAL_TABLET | Freq: Every day | ORAL | Status: DC
  Administered 2023-09-13: 5 mg via ORAL
  Filled 2023-09-12: qty 1

## 2023-09-12 MED ORDER — LEVOTHYROXINE SODIUM 75 MCG PO TABS
75.0000 ug | ORAL_TABLET | Freq: Every day | ORAL | Status: DC
  Administered 2023-09-13: 75 ug via ORAL
  Filled 2023-09-12: qty 1

## 2023-09-12 MED ORDER — SODIUM CHLORIDE 0.9 % IV SOLN: 1.0000 g | INTRAVENOUS | Status: DC

## 2023-09-12 MED ORDER — LACTATED RINGERS IV BOLUS
500.0000 mL | Freq: Once | INTRAVENOUS | Status: AC
  Administered 2023-09-12: 500 mL via INTRAVENOUS

## 2023-09-12 MED ORDER — ACETAMINOPHEN 325 MG PO TABS: 650.0000 mg | ORAL_TABLET | Freq: Four times a day (QID) | ORAL | Status: DC | PRN

## 2023-09-12 MED ORDER — ENOXAPARIN SODIUM 40 MG/0.4ML IJ SOSY
40.0000 mg | PREFILLED_SYRINGE | INTRAMUSCULAR | Status: DC
Start: 2023-09-13 — End: 2023-09-13
  Filled 2023-09-12: qty 0.4

## 2023-09-12 MED ORDER — GADOBUTROL 1 MMOL/ML IV SOLN
5.0000 mL | Freq: Once | INTRAVENOUS | Status: AC | PRN
  Administered 2023-09-12: 5 mL via INTRAVENOUS

## 2023-09-12 MED ORDER — LACTATED RINGERS IV BOLUS
500.0000 mL | Freq: Once | INTRAVENOUS | Status: AC
Start: 2023-09-12 — End: 2023-09-12
  Administered 2023-09-12: 500 mL via INTRAVENOUS

## 2023-09-12 MED ORDER — SODIUM CHLORIDE 0.9 % IV SOLN
1.0000 g | Freq: Once | INTRAVENOUS | Status: AC
  Administered 2023-09-12: 1 g via INTRAVENOUS
  Filled 2023-09-12: qty 10

## 2023-09-12 MED ORDER — ROSUVASTATIN CALCIUM 5 MG PO TABS: 5.0000 mg | ORAL_TABLET | Freq: Every evening | ORAL | Status: DC

## 2023-09-12 NOTE — Plan of Care (Signed)
 FMTS Brief Progress Note  S: Reported bouts of confusion and recurrent falls at home.  Husband was concerned and wanted her to get evaluated.  Also reports some symptoms of lightheadedness and dizziness prior to admission. Patient denies recurrent symptoms while admitted.  Denies dysuria or increased urinary frequency.  Denies fevers chills, nausea or emesis.  O: BP 122/73 (BP Location: Left Arm)   Pulse 76   Temp 98.9 F (37.2 C) (Oral)   Resp 16   SpO2 100%   Exam: Gen: No acute distress, awake Neuro: Alert and oriented to person, place, situation and location, disoriented to month Heart: Regular rate and rhythm, no murmurs appreciated Lung: Clear to auscultation bilaterally in posterior lung fields, normal work of breathing Abd: Soft, nontender, non distended, bowel sounds present  A/P: Elaine Anderson is a 83 y.o. female w/ past medical history of DM2, HLD, HTN, hypothyroid s/p thyroidectomy due to thyroid cancer who presenting with AMS and frequent falls. She was found to have UTI.  - Bedside swallow passed  - Carb modified diet ordered  - f/u MRI results, considered neuro consult based on imaging  - s/p IV Rocephine x1 in the ED  - UA dirty, f/u Urine Culture pending  - Plan per day team  - Orders reviewed. Labs for AM ordered, which was adjusted as needed.  - If condition changes, plan includes re-evaluation.   Peterson Ao, MD 09/12/2023, 9:00 PM PGY-1, [facility] Night Resident  Please page [phone] with questions.

## 2023-09-12 NOTE — ED Notes (Signed)
 Admitting at bedside

## 2023-09-12 NOTE — Assessment & Plan Note (Addendum)
 Acute on chronic altered mental status. Head CT with prominence of ventricles, no other acute abnormality.  UA suggestive of UTI. Unremarkable EKG, CXR, trops. No obvious electrolyte disturbance.  There may be a component of progressive chronic confusion versus delirium in the setting of acute UTI.  Will collect additional workup and imaging for AMS.  Per husband, patient back at baseline at this time. - Admit to FMTS with attending Dr. Lum Babe - MRI brain ordered - Ammonia, TSH ordered - HIV, RPR ordered - Vitamin B-12, folate, thiamine ordered - Hold home oxybutynin while altered  - Redose LR fluid bolus - Neuro checks every 4 - Vital signs per floor - Fall, delirium precautions - PT/OT eval and treat - AM CBC, BMP, Mg, Phos

## 2023-09-12 NOTE — H&P (Signed)
 83 Y/O F with PMHx of GAD< Breast CA, DM2, HLD, HTN, Hypothyroidism S/P thyroidectomy due to thyroid cancer, was brought in by her husband for confusion which worsened this morning. The husband endorsed hx of frequent falls at home, and this morning, she fell slowly on her face on a soft carpet with no head injury or LOC. Also concern for UTI per her husband.   Exam: Gen: No distress Neuro: Awake and alert, oriented to self, place, person, but not time. No facial asymmetry or aphasia. No sensory loss. Power of 5/5 globally with normal reflexes Heart: S1 S2 normal, no murmurs. RRR Lung: CTA B/L Abd: Soft, NT/ND, BS+, and normal Ext: No edema  A/P: AMS: Baseline dementia CT head negative for acute CVA. However, it shows cerebral volume loss with disproportionate prominence of the ventricles, which can be seen in communicating hydrocephalus, such as NPH or disproportionate white matter volume loss. A nonemergent MRI of the brain was recommended. F/U MRI brain - discussed with her husband Obtain risk stratification labs - TSH, HIV, RPR, B12 Consider neurology consultation pending MRI brain report Implement fall precautions Consult PT/OT   UTI: UA shows positive nitrite and leukocyte WBC 14 F/U urine culture S/P one dose of IV Rocephine in the ED Continue IV A/B pending urine culture report Keep well hydrated orally  Review home regimen for her chronic problems.

## 2023-09-12 NOTE — ED Notes (Signed)
 CCMD called and notified

## 2023-09-12 NOTE — ED Notes (Signed)
 Patient transported to CT

## 2023-09-12 NOTE — Assessment & Plan Note (Addendum)
 UA with +leuk/nitrite, many bacteria. Patient afebrile with leukocytosis to 14.2. Appears dehydrated on exam.  - S/p Rocephin in ED - transition to PO abx tomorrow  - Fu urine culture - Redose LR fluid bolus  - Hold home oxybutynin while altered

## 2023-09-12 NOTE — Assessment & Plan Note (Addendum)
-   HTN: Hold home losartan while hypo-/normotensive - HLD: Cont home rosuvastatin - T2DM: Hold home metformin iso contrast. Monitor CBGs.  - Osteoporosis: cont home alendronate

## 2023-09-12 NOTE — Hospital Course (Addendum)
 Elaine Anderson is a 83 y.o.female with a past history of hypertension, type 2 diabetes, hyperlipidemia, hypothyroidism status post thyroidectomy due to thyroid cancer who was admitted to the [facility] Medicine teaching Service at [facility] for altered mental status and frequent falls at home. Her hospital course is detailed below:  Altered mental status:  Patient presented to the ED with weakness, frequent falls and acute on chronic change in mental status.  Husband was difficulty getting her up to ambulate and EMS brought for evaluation.  Labs were overall unremarkable for any severe electrolyte, hyper ammonia, folate or B12 abnormalites. The patient did have UA concerning for urinary tract infection. Of note, CT head negative for acute ischemic event.  However he did show cerebral volume loss with disproportionate prominence of the ventricles. Symptoms were suspected to be due to possible communicating hydrocephalus such as normal pressure hydrocephalus or disproportionate white matter volume loss.  However, MRI showed no acute findings including stroke and NPH.  Patient was evaluated by physical therapy and OT who recommended DME, which patient and family accepted. Patient and family expressed desire to go home after observation in hospital with antibiotics.  Urinary Tract Infection:  UA with nitrites, WBC 14 and leukocytes.  She was given a dose of IV Rocephin in the ED. Leukocytosis resolved overnight with fluids. Urine culture was collected and demonstrated gram-negative rods. She was transitioned from IV ceftriaxone (4/10-4/11) to p.o. cefadroxil for six more days for resolution of urinary tract infection. Final growth in urine culture pending at discharge.  Hypotension, mild Held home losartan outpatient until PCP follow up given softer BP over admission without losartan.  Other chronic conditions were medically managed with home medications and formulary alternatives as necessary (hypertension,  hyperlipidemia, type 2 diabetes, osteoporosis)  PCP Follow-up Recommendations: Follow up resolution of symptoms and urine culture results Assess restarting losartan for BP

## 2023-09-12 NOTE — ED Triage Notes (Signed)
 Pt BIB [facility] from home where husband noticed bizarre behavior. Pt is A/Ox4 at baseline & husband states he has noticed a recent cognitive decline over "a while," then when she woke this morning she was rolling back & forth in the bed in a bizarre fashion. EMS reports she is A/Ox4, negative on their stroke screen & was noticed to be rolling herself around on the bed but not acknowledging that she is doing it. 140/55, 85 bpm, 18 resp, 98% on RA, CBG 95, no PIV.

## 2023-09-12 NOTE — H&P (Signed)
 Hospital Admission History and Physical Service Pager: [phone]  Patient name: Elaine Anderson Medical record number: [id number] Date of Birth: 1940/11/09 Age: 83 y.o. Gender: female  Primary Care Provider: Gillian Scarce, MD Consultants: None Code Status: DNR/DNI - discussed with patient and family  Preferred Emergency Contact: Elaine Anderson [phone]  Chief Complaint: AMS  Assessment and Plan: Elaine Anderson is a 84 y.o. female presenting with AMS found to have UTI.  Differential includes:  Infection: Most likely given UA demonstrating UTI. Leukocytosis to 14.2.  Hydrocephalus: Ventricular prominence with cerebral volume loss on CT.  Stroke: No acute hemorrhage, infarct, or mass on CT.  Hypoglycemia: Normal BG on arrival.  Uremia: Normal BUN, Cr on arrival.  Electrolyte disturbance: No obvious electrolyte derangements on arrival.  Metabolic disturbance: Normal LFTs. Will obtain ammonia.  Arrhythmia: Unremarkable EKG. Negative trops.  Medications: Reportedly taking medications as prescribed. No home medications overtly sedating.  Trauma: No head injury reported  Assessment & Plan Altered mental status Acute on chronic altered mental status. Head CT with prominence of ventricles, no other acute abnormality.  UA suggestive of UTI. Unremarkable EKG, CXR, trops. No obvious electrolyte disturbance.  There may be a component of progressive chronic confusion versus delirium in the setting of acute UTI.  Will collect additional workup and imaging for AMS.  Per husband, patient back at baseline at this time. - Admit to FMTS with attending Dr. Lum Babe - MRI brain ordered - Ammonia, TSH ordered - HIV, RPR ordered - Vitamin B-12, folate, thiamine ordered - Hold home oxybutynin while altered  - Redose LR fluid bolus - Neuro checks every 4 - Vital signs per floor - Fall, delirium precautions - PT/OT eval and treat - AM CBC, BMP, Mg, Phos UTI (urinary tract infection) UA with  +leuk/nitrite, many bacteria. Patient afebrile with leukocytosis to 14.2. Appears dehydrated on exam.  - S/p Rocephin in ED - transition to PO abx tomorrow  - Fu urine culture - Redose LR fluid bolus  - Hold home oxybutynin while altered Chronic health problem - HTN: Hold home losartan while hypo-/normotensive - HLD: Cont home rosuvastatin - T2DM: Hold home metformin iso contrast. Monitor CBGs.  - Osteoporosis: cont home alendronate   FEN/GI: NPO pending bedside swallow VTE Prophylaxis: Lovenox  Disposition: Med-Surg  History of Present Illness:  Elaine Anderson is a 83 y.o. female presenting with AMS. Patient here with husband, who largely provided HPI.   Has been went to wake patient this morning and found her unable to get out of bed, unable to speak, unable to stand, slid out of bed onto the floor.  Never lost consciousness, though fell back to sleep upon waking, was disoriented.  No limb shaking or tongue biting.  Has fallen more over the past few years due to balance issues, last fall 3 weeks ago.    Has chronic urinary incontinence.  Reports increased urinary frequency over the past weeks to months.  Notes some dysuria.  No changes to appearance of urine.  Has some constipation at baseline.  No N/V/D.  Does not eat much at baseline, sometimes chokes on her food.  Per husband, patient with confusion at baseline, maybe dementia, ongoing for years.  Does not always know the day of the week.  Always knows herself and her husband.  In the ED, initial workup demonstrated leukocytosis (14.2), ventricular prominence on head CT imaging (no other acute findings), and UA with leuk esterase, nitrites, and bacteria. Unremarkable EKG,  CXR, and trops. Patient given fluid bolus and Rocephin x1.   Review Of Systems: Per HPI  Pertinent Past Medical History: Breast cancer (1991) Thyroid cancer (2007, s/p thyroidectomy) T2DM HTN HLD Remainder reviewed in history tab.   Pertinent Past  Surgical History: Thyroidectomy  Hysterectomy L Mastectomy  Hx of back surgery Remainder reviewed in history tab.   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   Important Outpatient Medications: Alendronate 70 every Monday Escitalopram 5 daily  Levothyroxine 75 daily  Losartan 50 daily  Metformin 1000 daily  Oxybutynin 5 BID Rosuvastatin 5 daily  Calcium, vitamin D, MVI  Remainder reviewed in medication history.   Objective: BP (!) 118/57   Pulse 72   Temp 98.1 F (36.7 C) (Oral)   Resp 14   SpO2 100%  Exam: General: Well-appearing. Resting comfortably in room. CV: Normal S1/S2. No extra heart sounds. Warm and well-perfused. Pulm: Breathing comfortably on room air. CTAB. No increased WOB. Abd: Normoactive BS. Soft, non-distended. Mild tenderness of lower quadrants and suprapubic region.  Skin:  Warm, dry. Cap refill 3-4 s.  Neuro: Alert and oriented to self and place, not oriented to time. 5/5 strength of upper and lower extremities.  CN3,4,6: PERRLA. EOMI CN5: Sensation intact BL CN7: Facial expressions symmetric CN10: Regular speech though confused CN11: Turns head against resistance CN12: Tongue midline   Labs:  CBC BMET  Recent Labs  Lab 09/12/23 1030 09/12/23 1103  WBC 14.2*  --   HGB 11.2* 11.6*  HCT 34.3* 34.0*  PLT 151  --    Recent Labs  Lab 09/12/23 1030 09/12/23 1103  NA 135 136  K 3.9 4.1  CL 103 103  CO2 23  --   BUN 16 19  CREATININE 0.80 0.80  GLUCOSE 118* 111*  CALCIUM 8.7*  --      Urinalysis    Component Value Date/Time   COLORURINE AMBER (A) 09/12/2023 1030   APPEARANCEUR CLOUDY (A) 09/12/2023 1030   LABSPEC 1.016 09/12/2023 1030   PHURINE 7.0 09/12/2023 1030   GLUCOSEU NEGATIVE 09/12/2023 1030   HGBUR SMALL (A) 09/12/2023 1030   BILIRUBINUR NEGATIVE  09/12/2023 1030   KETONESUR NEGATIVE 09/12/2023 1030   PROTEINUR NEGATIVE 09/12/2023 1030   NITRITE POSITIVE (A) 09/12/2023 1030   LEUKOCYTESUR TRACE (A) 09/12/2023 1030   Trop 5>5.  EKG: No ischemic findings.   Imaging Studies Performed: CXR: No acute findings.  CT Head: No acute intracranial abnormality. Disproportionate prominence of ventricles.    Ivery Quale, MD 09/12/2023, 3:03 PM PGY-1, [facility]  FPTS Intern pager: [phone], text pages welcome Secure chat group [facility] Teaching Service

## 2023-09-12 NOTE — ED Notes (Signed)
 Patient transported to X-ray

## 2023-09-12 NOTE — ED Provider Notes (Signed)
 [place] EMERGENCY DEPARTMENT AT [facility] Provider Note   CSN: [id number] Arrival date & time: 09/12/23  0920     History  Chief Complaint  Patient presents with   Bizarre Behavior    Elaine Anderson is a 83 y.o. female.  HPI 83 year old female presents with weakness and abnormal behavior.  History is primarily from the husband at the bedside.  The patient has been progressively getting weaker and falls more easily and has had progressive cognitive decline for a few years.  Yesterday she was at work though had to sit down due to weakness.  However today when husband tried to go wake her up to get her ready for her doctor visit, she was very hard to wake up and then he could not get her out of bed.  Typically she has a hard time standing on her own when she gets up, she will be able to ambulate on her own, perhaps with a cane.  However he could not get her up at all on her own.  She seemed to be rolling back-and-forth and was agitated shortly thereafter and so he called EMS.  The abnormal behavior stopped shortly after EMS arrived.  She seems to be closer to her mental status baseline at this time.  She has a cough for the last several weeks that is attributed to allergies.  Otherwise she has not had fever, complains of headache, chest pain, or any other new/concerning symptoms.  She typically eats and drinks very little, though not recently worse than baseline.  Home Medications Prior to Admission medications   Medication Sig Start Date End Date Taking? Authorizing Provider  alendronate (FOSAMAX) 70 MG tablet Take 70 mg by mouth every Monday.   Yes [provider]  CALCIUM PO Take 1 tablet by mouth daily.   Yes [provider]  Cholecalciferol (VITAMIN D-3 PO) Take 1 capsule by mouth daily.   Yes [provider]  escitalopram (LEXAPRO) 5 MG tablet Take 5 mg by mouth daily.   Yes [provider]  ibuprofen (ADVIL) 200 MG tablet Take 400 mg  by mouth daily as needed for headache or moderate pain (pain score 4-6).   Yes [provider]  levothyroxine (SYNTHROID) 75 MCG tablet Take 75 mcg by mouth daily before breakfast. 07/18/20  Yes [provider]  losartan (COZAAR) 50 MG tablet Take 50 mg by mouth daily. 01/13/20  Yes [provider]  metFORMIN (GLUCOPHAGE) 1000 MG tablet Take 1,000 mg by mouth daily. 07/18/20 11/09/23 Yes [provider]  Multiple Vitamins-Minerals (MULTIVITAMIN WOMEN 50+) TABS Take 1 tablet by mouth daily.   Yes [provider]  oxybutynin (DITROPAN) 5 MG tablet Take 5 mg by mouth 2 (two) times daily.   Yes [provider]  rosuvastatin (CRESTOR) 5 MG tablet Take 5 mg by mouth every evening. 12/03/16  Yes [provider]  glucose blood test strip Use as instructed 12/24/12   Gillian Scarce, MD      Allergies    Alcohol    Review of Systems   Review of Systems  Unable to perform ROS: Dementia    Physical Exam Updated Vital Signs BP (!) 118/57   Pulse 72   Temp 98.1 F (36.7 C) (Oral)   Resp 14   SpO2 100%  Physical Exam Vitals and nursing note reviewed.  Constitutional:      Appearance: She is well-developed.  HENT:     Head: Normocephalic  and atraumatic.     Mouth/Throat:     Mouth: Mucous membranes are dry.  Eyes:     Extraocular Movements: Extraocular movements intact.     Pupils: Pupils are equal, round, and reactive to light.  Cardiovascular:     Rate and Rhythm: Normal rate and regular rhythm.     Heart sounds: Normal heart sounds.  Pulmonary:     Effort: Pulmonary effort is normal.     Breath sounds: Normal breath sounds.  Abdominal:     General: There is no distension.     Palpations: Abdomen is soft.     Tenderness: There is no abdominal tenderness.  Musculoskeletal:     Cervical back: Normal range of motion. No rigidity.  Skin:    General: Skin is warm and dry.  Neurological:     Mental Status: She is alert.      Comments: CN 3-12 grossly intact. 5/5 strength in all 4 extremities. Grossly normal sensation. Normal finger to nose.      ED Results / Procedures / Treatments   Labs (all labs ordered are listed, but only abnormal results are displayed) Labs Reviewed  COMPREHENSIVE METABOLIC PANEL WITH GFR - Abnormal; Notable for the following components:      Result Value   Glucose, Bld 118 (*)    Calcium 8.7 (*)    Total Protein 6.2 (*)    Albumin 3.3 (*)    All other components within normal limits  CBC WITH DIFFERENTIAL/PLATELET - Abnormal; Notable for the following components:   WBC 14.2 (*)    RBC 3.76 (*)    Hemoglobin 11.2 (*)    HCT 34.3 (*)    Neutro Abs 12.9 (*)    Lymphs Abs 0.6 (*)    All other components within normal limits  URINALYSIS, W/ REFLEX TO CULTURE (INFECTION SUSPECTED) - Abnormal; Notable for the following components:   Color, Urine AMBER (*)    APPearance CLOUDY (*)    Hgb urine dipstick SMALL (*)    Nitrite POSITIVE (*)    Leukocytes,Ua TRACE (*)    Bacteria, UA MANY (*)    All other components within normal limits  I-STAT CHEM 8, ED - Abnormal; Notable for the following components:   Glucose, Bld 111 (*)    Calcium, Ion 1.06 (*)    Hemoglobin 11.6 (*)    HCT 34.0 (*)    All other components within normal limits  URINE CULTURE  TROPONIN I (HIGH SENSITIVITY)  TROPONIN I (HIGH SENSITIVITY)    EKG EKG Interpretation Date/Time:  Thursday September 12 2023 09:40:08 EDT Ventricular Rate:  79 PR Interval:  187 QRS Duration:  93 QT Interval:  391 QTC Calculation: 449 R Axis:   72  Text Interpretation: Sinus rhythm no acute ST/T changes No old tracing to compare Confirmed by Pricilla Loveless [id number]) on 09/12/2023 9:46:53 AM  Radiology CT Head Wo Contrast Result Date: 09/12/2023 CLINICAL DATA:  Mental status change, unknown cause EXAM: CT HEAD WITHOUT CONTRAST TECHNIQUE: Contiguous axial images were obtained from the base of the skull through the vertex without  intravenous contrast. RADIATION DOSE REDUCTION: This exam was performed according to the departmental dose-optimization program which includes automated exposure control, adjustment of the mA and/or kV according to patient size and/or use of iterative reconstruction technique. COMPARISON:  None Available. FINDINGS: Brain: Disproportionate prominence of the ventricles with respect to the sulci, which may be seen in disproportion of white matter volume loss or in communicating hydrocephalus, such as  NPH. Scattered periventricular white matter hypoattenuation, most consistent with changes of mild chronic ischemic microvascular disease. No evidence of acute territorial infarction, extra-axial fluid collection, hemorrhage, or mass lesion. The basilar cisterns are patent without downward herniation. Cerebellar volume loss with more pronounced volume loss of the cerebellar vermis. Vascular: No hyperdense vessel. Calcified atherosclerotic plaque within the cavernous/supraclinoid ICA and intradural vertebral arteries. Skull: Normal. Negative for fracture or focal lesion. Sinuses/Orbits: The paranasal sinuses and mastoids are clear. The globes appear intact. No retrobulbar hematoma. Other: None. IMPRESSION: 1. No acute intracranial abnormality, specifically, no acute hemorrhage, territorial infarction, or intracranial mass. 2. Cerebral volume loss with disproportionate prominence of the ventricles, which can be seen in a communicating hydrocephalus, such as NPH or disproportionate white matter volume loss. A nonemergent MRI of the brain with IV contrast may be of benefit for further characterization. Electronically Signed   By: Wallie Char M.D.   On: 09/12/2023 15:00   DG Chest 2 View Result Date: 09/12/2023 CLINICAL DATA:  cough, weakness EXAM: CHEST - 2 VIEW COMPARISON:  None Available. FINDINGS: Bilateral lung fields are clear. Bilateral costophrenic angles are clear. Normal cardio-mediastinal silhouette. No acute  osseous abnormalities. The soft tissues are within normal limits. Surgical staples noted overlying the left axillary region. IMPRESSION: No active cardiopulmonary disease. Electronically Signed   By: Jules Schick M.D.   On: 09/12/2023 11:46    Procedures Procedures    Medications Ordered in ED Medications  lactated ringers bolus 500 mL (0 mLs Intravenous Stopped 09/12/23 1310)  cefTRIAXone (ROCEPHIN) 1 g in sodium chloride 0.9 % 100 mL IVPB (0 g Intravenous Stopped 09/12/23 1343)    ED Course/ Medical Decision Making/ A&P                                 Medical Decision Making Amount and/or Complexity of Data Reviewed Labs: ordered.    Details: Leukocytosis and UTI Radiology: ordered and independent interpretation performed.    Details: No head bleed, large ventricles. ECG/medicine tests: ordered and independent interpretation performed.    Details: No ischemia  Risk Decision regarding hospitalization.   Patient presents with acute change in mental status and generalized weakness.  I suspect this is coming from a nitrite positive UTI seen on workup.  She is also having leukocytosis.  She was given IV fluids and IV Rocephin.  CT head shows what could be NPH though this would probably cause more of her longer term symptoms rather than acute worsening mental status.  Discussed with family practice, they can consider MRI while in the hospital as well as per radiology recommendations.  I doubt meningitis at this time.  Admit to the family practice service.        Final Clinical Impression(s) / ED Diagnoses Final diagnoses:  Acute urinary tract infection    Rx / DC Orders ED Discharge Orders     None         Pricilla Loveless, MD 09/12/23 [phone]

## 2023-09-13 DIAGNOSIS — R404 Transient alteration of awareness: Secondary | ICD-10-CM | POA: Diagnosis not present

## 2023-09-13 DIAGNOSIS — N390 Urinary tract infection, site not specified: Secondary | ICD-10-CM | POA: Diagnosis not present

## 2023-09-13 LAB — CBC
HCT: 31.6 % — ABNORMAL LOW (ref 36.0–46.0)
Hemoglobin: 10.5 g/dL — ABNORMAL LOW (ref 12.0–15.0)
MCH: 29.8 pg (ref 26.0–34.0)
MCHC: 33.2 g/dL (ref 30.0–36.0)
MCV: 89.8 fL (ref 80.0–100.0)
Platelets: 154 10*3/uL (ref 150–400)
RBC: 3.52 MIL/uL — ABNORMAL LOW (ref 3.87–5.11)
RDW: 13.5 % (ref 11.5–15.5)
WBC: 10.4 10*3/uL (ref 4.0–10.5)
nRBC: 0 % (ref 0.0–0.2)

## 2023-09-13 LAB — BASIC METABOLIC PANEL WITH GFR
Anion gap: 10 (ref 5–15)
BUN: 13 mg/dL (ref 8–23)
CO2: 23 mmol/L (ref 22–32)
Calcium: 8.2 mg/dL — ABNORMAL LOW (ref 8.9–10.3)
Chloride: 105 mmol/L (ref 98–111)
Creatinine, Ser: 0.78 mg/dL (ref 0.44–1.00)
GFR, Estimated: >60 mL/min (ref >60)
Glucose, Bld: 103 mg/dL — ABNORMAL HIGH (ref 70–99)
Potassium: 3.5 mmol/L (ref 3.5–5.1)
Sodium: 138 mmol/L (ref 135–145)

## 2023-09-13 LAB — MAGNESIUM: Magnesium: 1.6 mg/dL — ABNORMAL LOW (ref 1.7–2.4)

## 2023-09-13 LAB — PHOSPHORUS: Phosphorus: 2.6 mg/dL (ref 2.5–4.6)

## 2023-09-13 LAB — GLUCOSE, CAPILLARY
Glucose-Capillary: 103 mg/dL — ABNORMAL HIGH (ref 70–99)
Glucose-Capillary: 178 mg/dL — ABNORMAL HIGH (ref 70–99)

## 2023-09-13 LAB — RPR: RPR Ser Ql: NONREACTIVE

## 2023-09-13 MED ORDER — CEFADROXIL 500 MG PO CAPS
500.0000 mg | ORAL_CAPSULE | Freq: Two times a day (BID) | ORAL | Status: DC
  Administered 2023-09-13: 500 mg via ORAL
  Filled 2023-09-13: qty 1

## 2023-09-13 MED ORDER — CEFADROXIL 500 MG PO CAPS: 500.0000 mg | ORAL_CAPSULE | Freq: Two times a day (BID) | ORAL | 0 refills | Status: AC

## 2023-09-13 MED ORDER — MAGNESIUM SULFATE 2 GM/50ML IV SOLN
2.0000 g | Freq: Once | INTRAVENOUS | Status: AC
  Administered 2023-09-13: 2 g via INTRAVENOUS
  Filled 2023-09-13: qty 50

## 2023-09-13 MED ORDER — ACETAMINOPHEN 325 MG PO TABS: 650.0000 mg | ORAL_TABLET | Freq: Four times a day (QID) | ORAL | Status: AC | PRN

## 2023-09-13 NOTE — Discharge Instructions (Addendum)
 Dear Elaine Anderson,  Thank you for letting us participate in your care. You were hospitalized for altered mental status and diagnosed with a urinary tract infection. You were treated with intravenous antibiotics and then switched to antibiotics by mouth.  POST-HOSPITAL & CARE INSTRUCTIONS Take your antibiotics regularly until there are none left.  Go to your follow up appointments (listed below)  DOCTOR'S APPOINTMENT     Follow-up Information     [name], MD. Call.   Specialty: Internal Medicine Why: Call your PCP to schedule a hospital follow-up appointment in the next week. Contact information: [address] [place] [address] [phone]                Take care and be well!  Family Medicine Teaching Service Inpatient Team [place]  [facility]  [address] [place], [place] [address] [phone]

## 2023-09-13 NOTE — Evaluation (Signed)
 Occupational Therapy Evaluation Patient Details Name: Elaine Anderson MRN: [id number] DOB: [birth year] Today's Date: 09/13/2023   History of Present Illness   Pt is an 83 y/o female presenting with AMS and fall at home. Found to have UTI. PMH: GAD, breast CA, DM2, HLD, HTN, hypothyroidism s/p thyroidectomy d/t thyroid cancer, dementia     Clinical Impressions PTA, pt lives with spouse, typically ambulatory in the home without AD and intermittently uses a cane in the community. Pt is typically Independent with ADLs, able to manage some IADLs (simple meals, med mgmt). Pt presents now with deficits in standing balance and cognition. Pt requiring CGA for mobility without AD with Min A needed to correct LOB x 2. Pt requires overall CGA for ADL mgmt. Spouse at bedside. Encouraged increased supervision with ADLs, increased assist with med mgmt and issued gait belt to use during mobility to reduce fall risk. Pt/spouse politely declined need for RW, BSC or HHOT. Will follow if pt remains admitted.      If plan is discharge home, recommend the following:   A little help with walking and/or transfers;A little help with bathing/dressing/bathroom;Assistance with cooking/housework;Direct supervision/assist for medications management;Direct supervision/assist for financial management;Assist for transportation;Supervision due to cognitive status     Functional Status Assessment   Patient has had a recent decline in their functional status and demonstrates the ability to make significant improvements in function in a reasonable and predictable amount of time.     Equipment Recommendations   None recommended by OT (pt/spouse declined need for RW or [facility])     Recommendations for Other Services         Precautions/Restrictions   Precautions Precautions: Fall Recall of Precautions/Restrictions: Impaired Restrictions Weight Bearing Restrictions Per Provider Order: No     Mobility Bed  Mobility Overal bed mobility: Modified Independent             General bed mobility comments: pt quickly scooting toward foot of bed around sidebedrail to sit EOB. cues for safety/waiting for OT assist    Transfers Overall transfer level: Needs assistance Equipment used: None Transfers: Sit to/from Stand Sit to Stand: Supervision, Contact guard assist                  Balance Overall balance assessment: Needs assistance Sitting-balance support: No upper extremity supported, Feet supported Sitting balance-Leahy Scale: Good     Standing balance support: No upper extremity supported, During functional activity Standing balance-Leahy Scale: Fair Standing balance comment: LOB with dynamic tasks and when distracted without UE support. able to stand statically without issues                           ADL either performed or assessed with clinical judgement   ADL Overall ADL's : Needs assistance/impaired Eating/Feeding: Independent;Sitting   Grooming: Supervision/safety;Standing   Upper Body Bathing: Set up;Sitting   Lower Body Bathing: Contact guard assist;Sitting/lateral leans;Sit to/from stand   Upper Body Dressing : Set up   Lower Body Dressing: Contact guard assist;Sitting/lateral leans;Sit to/from stand   Toilet Transfer: Contact guard assist;Minimal assistance;Ambulation;Regular Teacher, adult education Details (indicate cue type and reason): CGA primarily with Min A needed to correct LOB x 2. On entry to bathroom, pt removing BSC from over toilet with OT educating that they can assist to maximize safety. Toileting- Clothing Manipulation and Hygiene: Contact guard assist;Sit to/from stand;Sitting/lateral lean Toileting - Clothing Manipulation Details (indicate cue type and reason): for balance with  clothing mgmt in standing.     Functional mobility during ADLs: Contact guard assist;Minimal assistance General ADL Comments: LOB with mobility when not  using AD. Spouse present. Discussed RW for increased stability though both declined. Encouraged shower chair use and supervision for showering tasks to ensure safety initially, as well as increased med mgmt assist d/t impaired cognition currently.     Vision Baseline Vision/History: 1 Wears glasses;4 Cataracts Ability to See in Adequate Light: 1 Impaired Patient Visual Report: No change from baseline Vision Assessment?: Vision impaired- to be further tested in functional context Additional Comments: was supposed to have R cataract removed yesterday but had to come to hospital. already has had L cataract removed. still requires glasses due to astigmatism but unable to wear d/t L eye vision has been corrected     Perception         Praxis         Pertinent Vitals/Pain Pain Assessment Pain Assessment: No/denies pain     Extremity/Trunk Assessment Upper Extremity Assessment Upper Extremity Assessment: Overall WFL for tasks assessed;Right hand dominant   Lower Extremity Assessment Lower Extremity Assessment: Defer to PT evaluation   Cervical / Trunk Assessment Cervical / Trunk Assessment: Normal   Communication Communication Communication: Impaired Factors Affecting Communication: Other (comment) (thick accent)   Cognition Arousal: Alert Behavior During Therapy: WFL for tasks assessed/performed, Impulsive Cognition: History of cognitive impairments             OT - Cognition Comments: hx of dementia per chart, impulsive with movements with cues for safety needed. some difficulty recalling PLOF, decreased insight into balance deficits                 Following commands: Intact       Cueing  General Comments   Cueing Techniques: Verbal cues      Exercises     Shoulder Instructions      Home Living Family/patient expects to be discharged to:: Private residence Living Arrangements: Spouse/significant other Available Help at Discharge: Family;Available 24  hours/day Type of Home: Other(Comment) (townhouse) Home Access: Level entry     Home Layout: One level     Bathroom Shower/Tub: Producer, television/film/video: Standard     Home Equipment: Cane - single point          Prior Functioning/Environment Prior Level of Function : Needs assist;History of Falls (last six months)             Mobility Comments: no AD in the home, cane sometimes if going outside of the house. mostly sedentary ADLs Comments: Typically independent with ADLs (not getting in shower as much due to water not feeling hot enough). likes to watch [place] videos on her tablet, used to like flowers/plants. Spouse reports pt fills out her own pill box    OT Problem List: Decreased strength;Decreased activity tolerance;Impaired balance (sitting and/or standing);Decreased cognition;Decreased safety awareness;Decreased knowledge of use of DME or AE   OT Treatment/Interventions: Self-care/ADL training;Therapeutic exercise;Energy conservation;DME and/or AE instruction;Therapeutic activities;Patient/family education;Balance training      OT Goals(Current goals can be found in the care plan section)   Acute Rehab OT Goals Patient Stated Goal: home today OT Goal Formulation: With patient/family Time For Goal Achievement: 09/27/23 Potential to Achieve Goals: Good ADL Goals Pt Will Perform Lower Body Bathing: sit to/from stand;Independently Pt Will Perform Lower Body Dressing: Independently;sitting/lateral leans;sit to/from stand Pt Will Transfer to Toilet: Independently;ambulating Additional ADL Goal #1: Pt to verbalize at least  3 fall prevention strategies to implement at home Additional ADL Goal #2: Pt to complete three step trail making task with no more than min verbal cues   OT Frequency:  Min 2X/week    Co-evaluation              AM-PAC OT "6 Clicks" Daily Activity     Outcome Measure Help from another person eating meals?: None Help from another  person taking care of personal grooming?: A Little Help from another person toileting, which includes using toliet, bedpan, or urinal?: A Little Help from another person bathing (including washing, rinsing, drying)?: A Little Help from another person to put on and taking off regular upper body clothing?: A Little Help from another person to put on and taking off regular lower body clothing?: A Little 6 Click Score: 19   End of Session Equipment Utilized During Treatment: Gait belt Nurse Communication: Mobility status  Activity Tolerance: Patient tolerated treatment well Patient left: in bed;with call bell/phone within reach;with family/visitor present (EOB with breakfast)  OT Visit Diagnosis: Unsteadiness on feet (R26.81);Other abnormalities of gait and mobility (R26.89);Other symptoms and signs involving cognitive function                Time: 5621-3086 OT Time Calculation (min): 21 min Charges:  OT General Charges $OT Visit: 1 Visit OT Evaluation $OT Eval Low Complexity: 1 Low  Bradd Canary, OTR/L Acute Rehab Services Office: [phone]   Lorre Munroe 09/13/2023, 11:18 AM

## 2023-09-13 NOTE — Care Management Obs Status (Signed)
 MEDICARE OBSERVATION STATUS NOTIFICATION   Patient Details  Name: Zilah Villaflor MRN: [id number] Date of Birth: 16-Aug-1940   Medicare Observation Status Notification Given:  Yes    Lockie Pares, RN 09/13/2023, 2:48 PM

## 2023-09-13 NOTE — Plan of Care (Signed)

## 2023-09-13 NOTE — Discharge Summary (Addendum)
 Family Medicine Teaching [facility] Discharge Summary  Patient name: Elaine Anderson Medical record number: [id number] Date of birth: 1941/05/18 Age: 83 y.o. Gender: female Date of Admission: 09/12/2023  Date of Discharge:/04/2024 Admitting Physician: Doreene Eland, MD  Primary Care Provider: [name], MD Consultants: None  Indication for Hospitalization: UTI  Discharge Diagnoses/Problem List:  Principal Problem for Admission: UTI Other Problems addressed during stay:  Principal Problem:   Altered mental status Active Problems:   UTI (urinary tract infection)   Chronic health problem   Acute urinary tract infection   Hypomagnesemia  Brief Hospital Course:  Elaine Anderson is a 83 y.o.female with a past history of hypertension, type 2 diabetes, hyperlipidemia, hypothyroidism status post thyroidectomy due to thyroid cancer who was admitted to the [facility] Medicine teaching Service at [facility] for altered mental status and frequent falls at home. Her hospital course is detailed below:  Altered mental status:  Patient presented to the ED with weakness, frequent falls and acute on chronic change in mental status.  Husband was difficulty getting her up to ambulate and EMS brought for evaluation.  Labs were overall unremarkable for any severe electrolyte, hyper ammonia, folate or B12 abnormalites. The patient did have UA concerning for urinary tract infection. Of note, CT head negative for acute ischemic event.  However he did show cerebral volume loss with disproportionate prominence of the ventricles. Symptoms were suspected to be due to possible communicating hydrocephalus such as normal pressure hydrocephalus or disproportionate white matter volume loss.  However, MRI showed no acute findings including stroke and NPH.  Patient was evaluated by physical therapy and OT who recommended DME, which patient and family accepted. Patient and family expressed desire to go home after observation  in hospital with antibiotics.  Urinary Tract Infection:  UA with nitrites, WBC 14 and leukocytes.  She was given a dose of IV Rocephin in the ED. Leukocytosis resolved overnight with fluids. Urine culture was collected and demonstrated gram-negative rods. She was transitioned from IV ceftriaxone (4/10-4/11) to p.o. cefadroxil for six more days for resolution of urinary tract infection. Final growth in urine culture pending at discharge.  Hypotension, mild Held home losartan outpatient until PCP follow up given softer BP over admission without losartan.  Other chronic conditions were medically managed with home medications and formulary alternatives as necessary (hypertension, hyperlipidemia, type 2 diabetes, osteoporosis)  PCP Follow-up Recommendations: Follow up resolution of symptoms and urine culture results Assess restarting losartan for BP   Disposition: Home with outpatient OT and walker  Discharge Condition: Stable  Discharge Exam:  Vitals:   09/13/23 0334 09/13/23 0856  BP: 104/73 107/61  Pulse: 62 71  Resp: 16   Temp: 98.1 F (36.7 C) 98.4 F (36.9 C)  SpO2: 95% 97%   Constitutional: On earlier interview, elderly female, somewhat delayed answers, laying in bed.  Improved on later interview, sitting up, reactive, smiling, no acute distress Cardiovascular: Regular rate and rhythm no murmurs rubs or gallops Respiratory: Clear to auscultation bilaterally, no wheezes or rhonchi or rales Abdominal: Bowel sounds present, no tenderness in 4 quadrants to light the patient MSK: Full range of motion Neuro: Grossly intact Psych: Appropriate mood and affect.  Significant Procedures: None  Significant Labs and Imaging:  Recent Labs  Lab 09/12/23 1030 09/12/23 1103 09/13/23 0454  WBC 14.2*  --  10.4  HGB 11.2* 11.6* 10.5*  HCT 34.3* 34.0* 31.6*  PLT 151  --  154   Recent Labs  Lab 09/12/23  1030 09/12/23 1103 09/13/23 0454  NA 135 136 138  K 3.9 4.1 3.5  CL 103 103  105  CO2 23  --  23  GLUCOSE 118* 111* 103*  BUN 16 19 13   CREATININE 0.80 0.80 0.78  CALCIUM 8.7*  --  8.2*  MG  --   --  1.6*  PHOS  --   --  2.6  ALKPHOS 39  --   --   AST 20  --   --   ALT 13  --   --   ALBUMIN 3.3*  --   --     CXR (4/10):    IMPRESSION: No active cardiopulmonary disease.   CT Head (4/10):   IMPRESSION: 1. No acute intracranial abnormality, specifically, no acute hemorrhage, territorial infarction, or intracranial mass. 2. Cerebral volume loss with disproportionate prominence of the ventricles, which can be seen in a communicating hydrocephalus, such as NPH or disproportionate white matter volume loss. A nonemergent MRI of the brain with IV contrast may be of benefit for further characterization.   MRI Brain (4/10):   IMPRESSION: 1. No acute intracranial abnormality. 2. Age-related cerebral atrophy. 3. Chronic left maxillary sinusitis.  Results/Tests Pending at Time of Discharge: Urine culture identification/sensitivities  Discharge Medications:  Allergies as of 09/13/2023       Reactions   Alcohol Other (See Comments)   Flushing Increased blood pressure Near syncope (Reactions occur with any alcohol, including mouthwashes)        Medication List     PAUSE taking these medications    losartan 50 MG tablet Wait to take this until your doctor or other care provider tells you to start again. Commonly known as: COZAAR Take 50 mg by mouth daily.       STOP taking these medications    Advil 200 MG tablet Generic drug: ibuprofen       TAKE these medications    acetaminophen 325 MG tablet Commonly known as: TYLENOL Take 2 tablets (650 mg total) by mouth every 6 (six) hours as needed for moderate pain (pain score 4-6), mild pain (pain score 1-3) or fever.   alendronate 70 MG tablet Commonly known as: FOSAMAX Take 70 mg by mouth every Monday.   CALCIUM PO Take 1 tablet by mouth daily.   cefadroxil 500 MG  capsule Commonly known as: DURICEF Take 1 capsule (500 mg total) by mouth 2 (two) times daily for 6 days.   escitalopram 5 MG tablet Commonly known as: LEXAPRO Take 5 mg by mouth daily.   glucose blood test strip Use as instructed   levothyroxine 75 MCG tablet Commonly known as: SYNTHROID Take 75 mcg by mouth daily before breakfast.   metFORMIN 1000 MG tablet Commonly known as: GLUCOPHAGE Take 1,000 mg by mouth daily.   Multivitamin Women 50+ Tabs Take 1 tablet by mouth daily.   oxybutynin 5 MG tablet Commonly known as: DITROPAN Take 5 mg by mouth 2 (two) times daily.   rosuvastatin 5 MG tablet Commonly known as: CRESTOR Take 5 mg by mouth every evening.   VITAMIN D-3 PO Take 1 capsule by mouth daily.               Durable Medical Equipment  (From admission, onward)           Start     Ordered   09/13/23 1351  DME Walker  Once       Question Answer Comment  Walker: With 5 [employer]  Patient needs a walker to treat with the following condition Physical deconditioning      09/13/23 1351            Discharge Instructions: Please refer to Patient Instructions section of EMR for full details.  Patient was counseled important signs and symptoms that should prompt return to medical care, changes in medications, dietary instructions, activity restrictions, and follow up appointments.   Follow-Up Appointments:  Follow-up Information     [name], MD. Call.   Specialty: Internal Medicine Why: Call your PCP to schedule a hospital follow-up appointment in the next week. Contact information: [address] [place] [address] [phone]         [facility] Follow up.   Specialty: Rehabilitation Why: They will call you to set up services Contact information: [address]  [address] [place] [address] [phone]        [employer] Follow  up.   Why: Rolling walker Contact information: [address] [place] [address] [phone]                 Tomie China, MD 09/13/2023, 2:17 PM PGY-1, [facility]  I agree with the assessment and plan as documented above.  Janeal Holmes, MD PGY-2, [facility]

## 2023-09-13 NOTE — Care Management CC44 (Signed)
 Condition Code 44 Documentation Completed  Patient Details  Name: Colbi Schiltz MRN: [id number] Date of Birth: Nov 01, 1940   Condition Code 44 given:  Yes Patient signature on Condition Code 44 notice:  Yes Documentation of 2 MD's agreement:  Yes Code 44 added to claim:  Yes    Lockie Pares, RN 09/13/2023, 2:49 PM

## 2023-09-13 NOTE — Assessment & Plan Note (Deleted)
 Improved WBC 14.2 --> 10.4. Awaiting UCx susceptibilities.  - Continue IV ceftriaxone 1g qday, will narrow as able.  - Fu urine culture - Hold home oxybutynin while altered

## 2023-09-13 NOTE — Care Management (Addendum)
  Transition of Care [facility]) Screening Note   Patient Details  Name: Elaine Anderson Date of Birth: 09-Sep-1940   Transition of Care [facility]) CM/SW Contact:    Lockie Pares, RN Phone Number: 09/13/2023, 11:22 AM    Transition of Care Department [facility]) has reviewed patient and no TOC needs have been identified at this time. We will continue to monitor patient advancement through interdisciplinary progression rounds. If new patient transition needs arise, please place a TOC consult.  1400 PT assessed paitent and she did agree to [facility] and a walker. Called [facility] for walker. [facility] [place] ( as the paitents lives in [place]) both oxayed by family.  1450 had to give code 67, they want walker cancelled as they have one they will get from a friend. Zollie Beckers from [place] notified.

## 2023-09-13 NOTE — Assessment & Plan Note (Deleted)
-   HTN: Continue to hold home losartan while hypo-/normotensive - HLD: Contine home rosuvastatin - T2DM: CBGs WNL. Will restart metformin at discharge.  - Osteoporosis: Continue home alendronate

## 2023-09-13 NOTE — Evaluation (Signed)
 Speech Language Pathology Evaluation Patient Details Name: Elaine Anderson MRN: [id number] DOB: 08/16/1940 Today's Date: 09/13/2023 Time: 4098-1191 SLP Time Calculation (min) (ACUTE ONLY): 15 min  Problem List:  Patient Active Problem List   Diagnosis Date Noted   Hypomagnesemia 09/13/2023   Altered mental status 09/12/2023   UTI (urinary tract infection) 09/12/2023   Chronic health problem 09/12/2023   Acute urinary tract infection 09/12/2023   Type II or unspecified type diabetes mellitus without mention of complication, not stated as uncontrolled 09/10/2013   Anxiety state 09/10/2013   OAB (overactive bladder) 09/10/2013   Otalgia 05/03/2013   Impacted cerumen of right ear 05/03/2013   Essential hypertension, benign 05/03/2013   Type II or unspecified type diabetes mellitus without mention of complication, uncontrolled 12/23/2012   Other and unspecified hyperlipidemia 12/23/2012   Hypothyroidism 12/23/2012   Irritable mood 12/23/2012   Overactive bladder 12/23/2012   Personal history of malignant neoplasm of breast 12/23/2012   Breast screening 12/23/2012   Post-surgical hypothyroidism    Diabetes type 2, controlled (HCC)    Breast cancer (HCC)    Past Medical History:  Past Medical History:  Diagnosis Date   Anxiety    Breast cancer (HCC) 1991   Diabetes type 2, controlled (HCC)    Hyperlipidemia    Hypertension    Macular disorder    macular bubble    Other disorders of ear(388.8)    Post-surgical hypothyroidism    Rash and other nonspecific skin eruption    Thyroid cancer (HCC) 2007   Past Surgical History:  Past Surgical History:  Procedure Laterality Date   ABDOMINAL HYSTERECTOMY     Menorrhagia/Dysmenorrhea   MASTECTOMY Left    OOPHORECTOMY     Side unspecified    THYROIDECTOMY     HPI:  83 yr old admitted with inability to get out of bed, unable to speak. Per chart pt has "confusion at baseline, maybe dementia, ongoing for years" (doesn't always know  the day of the week).  MRI no acute intracranial abnormalit, age-related cerebral atrophy.   Assessment / Plan / Recommendation Clinical Impression  Pt assessed with history of congitive impairments and suspected to be functioning at her baseline today with husband in agreement. He reports memory deficits that are not new and she is typically not oriented to time. Her speech was clear and intelligible and no deficits with language (followed 2 step command) and able to express her thoughts. She stated biographical information accurately, no difficulty answering simple problem solving questions although suspect she would have difficulty performing more complex problem solving activities.She was able to name 7 animals in one minute which is suspected to be close to or at baseline. Pt husband agrees that no further ST is warranted. Pt completes her own pill box and husband states he monitors this. Educated spouse to continue closely supervising this especially once she is back at home. No further ST warranted.    SLP Assessment  SLP Recommendation/Assessment: Patient does not need any further Speech Lanaguage Pathology Services SLP Visit Diagnosis: Cognitive communication deficit (R41.841)    Recommendations for follow up therapy are one component of a multi-disciplinary discharge planning process, led by the attending physician.  Recommendations may be updated based on patient status, additional functional criteria and insurance authorization.    Follow Up Recommendations  No SLP follow up    Assistance Recommended at Discharge  Frequent or constant Supervision/Assistance (initially once home)  Functional Status Assessment Patient has not had a recent decline  in their functional status  Frequency and Duration           SLP Evaluation Cognition  Overall Cognitive Status: History of cognitive impairments - at baseline Arousal/Alertness: Awake/alert Orientation Level: Oriented to person;Oriented  to place;Disoriented to situation (husband stated is not typically oriented to time) Attention: Sustained Sustained Attention: Appears intact Memory: Impaired Memory Impairment: Retrieval deficit (forgot about UTI but once given cue from husband she remembered) Awareness:  (suspect at baseline) Problem Solving: Appears intact (for simple verbal only, suspect may have difficulty implementing during activities) Safety/Judgment: Other (comment) (suspect impaired at baseline)       Comprehension  Auditory Comprehension Overall Auditory Comprehension: Appears within functional limits for tasks assessed Commands: Within Functional Limits (followed 2 step command) Visual Recognition/Discrimination Discrimination: Not tested Reading Comprehension Reading Status: Not tested    Expression Expression Primary Mode of Expression: Verbal Verbal Expression Overall Verbal Expression: Appears within functional limits for tasks assessed Initiation: No impairment Level of Generative/Spontaneous Verbalization: Conversation Repetition:  (NT) Naming: Impairment Divergent:  (named 7 animals -suspect baseline) Pragmatics: No impairment Written Expression Written Expression: Not tested   Oral / Motor  Oral Motor/Sensory Function Overall Oral Motor/Sensory Function: Within functional limits Motor Speech Overall Motor Speech: Appears within functional limits for tasks assessed Respiration: Within functional limits Phonation: Normal Resonance: Within functional limits Articulation: Within functional limitis Intelligibility: Intelligible Motor Planning: Witnin functional limits Motor Speech Errors: Not applicable            Royce Macadamia 09/13/2023, 11:40 AM

## 2023-09-13 NOTE — Assessment & Plan Note (Deleted)
 MRI showed age-related cerebral atrophy. AMS labs negative, including: B12, B9, TSH, ammonia, RPR, and HIV. Likely related to UTI -- patient returning to baseline after antibiotics, per family.  Oriented to self, situation, and month -- difficult to parse ongoing AMS due to UTI, symptoms of underlying dementia, and delirium.  Will defer neurological consult for now.  *** - Vital signs per floor - Fall, delirium precautions - PT/OT/SLP eval and treat, appreciate recs - AM CBC, BMP

## 2023-09-13 NOTE — Evaluation (Signed)
 Physical Therapy Evaluation  Patient Details Name: Elaine Anderson MRN: [id number] DOB: [birth year] Today's Date: 09/13/2023  History of Present Illness  Pt is an 83 y/o female presenting with AMS and fall at home. Found to have UTI. PMH: GAD, breast CA, DM2, HLD, HTN, hypothyroidism s/p thyroidectomy d/t thyroid cancer, dementia  Clinical Impression  Pt admitted with above diagnosis. Pt currently with functional limitations due to the deficits listed below (see PT Problem List). At the time of PT eval pt was able to perform transfers and ambulation with min assist without an AD, and close guard for safety with RW for support. Pt able to demonstrate a significant improvement in gait stability with RW, and was agreeable to RW at d/c after practicing with it. Recommend post-acute rehab to maximize functional independence, safety, and decrease risk for future falls. Pt will benefit from acute skilled PT to increase their independence and safety with mobility to allow discharge.           If plan is discharge home, recommend the following: A little help with walking and/or transfers;A little help with bathing/dressing/bathroom;Assistance with cooking/housework;Assist for transportation;Help with stairs or ramp for entrance;Supervision due to cognitive status   Can travel by private vehicle        Equipment Recommendations Rolling walker (2 wheels)  Recommendations for Other Services       Functional Status Assessment Patient has had a recent decline in their functional status and demonstrates the ability to make significant improvements in function in a reasonable and predictable amount of time.     Precautions / Restrictions Precautions Precautions: Fall Recall of Precautions/Restrictions: Impaired Restrictions Weight Bearing Restrictions Per Provider Order: No      Mobility  Bed Mobility               General bed mobility comments: Pt was received sittiing up in bedside chair  - visitor chair without arm rests.    Transfers Overall transfer level: Needs assistance Equipment used: None, Rolling walker (2 wheels) Transfers: Sit to/from Stand Sit to Stand: Supervision, Contact guard assist           General transfer comment: Hands on guarding required without AD, however close supervision with RW. Pt cued for lining up completely with chair and keeping walker close until ready to initiate sit.    Ambulation/Gait Ambulation/Gait assistance: Contact guard assist, Min assist Gait Distance (Feet): 175 Feet Assistive device: Rolling walker (2 wheels), Straight cane Gait Pattern/deviations: Step-through pattern, Decreased stride length, Trunk flexed Gait velocity: Decreased Gait velocity interpretation: 1.31 - 2.62 ft/sec, indicative of limited community ambulator   General Gait Details: VC's for safety and hands on for assist. Min assist with SPC for unsteadiness/LOB and CGA with RW. Pt able to improve gait speed, posture, and balance with RW use. Noted pt closing one eye and focusing to the L. Daughter reports recent Lasik surgery on one eye only and was supposed to go back for second eye surgery yesterday.  Stairs            Wheelchair Mobility     Tilt Bed    Modified Rankin (Stroke Patients Only)       Balance Overall balance assessment: Needs assistance Sitting-balance support: No upper extremity supported, Feet supported Sitting balance-Leahy Scale: Good     Standing balance support: No upper extremity supported, During functional activity Standing balance-Leahy Scale: Fair Standing balance comment: LOB with dynamic tasks and when distracted without UE support. able to stand statically  without issues                             Pertinent Vitals/Pain Pain Assessment Pain Assessment: No/denies pain    Home Living Family/patient expects to be discharged to:: Private residence Living Arrangements: Spouse/significant  other Available Help at Discharge: Family;Available 24 hours/day Type of Home: Other(Comment) (townhouse) Home Access: Level entry       Home Layout: One level Home Equipment: Cane - single point      Prior Function Prior Level of Function : Needs assist;History of Falls (last six months)             Mobility Comments: no AD in the home, cane sometimes if going outside of the house. mostly sedentary ADLs Comments: Typically independent with ADLs (not getting in shower as much due to water not feeling hot enough). likes to watch [place] videos on her tablet, used to like flowers/plants. Spouse reports pt fills out her own pill box     Extremity/Trunk Assessment   Upper Extremity Assessment Upper Extremity Assessment: Defer to OT evaluation    Lower Extremity Assessment Lower Extremity Assessment: Generalized weakness;RLE deficits/detail;LLE deficits/detail RLE Deficits / Details: Bilaterally, pt with 3/5 strength in hamstrings and 4-/5 strength in quads, hip flexors, and ankle DF. Grossly, posterior chain is weak.    Cervical / Trunk Assessment Cervical / Trunk Assessment: Other exceptions Cervical / Trunk Exceptions: Forward head posture with rounded shoulders  Communication   Communication Communication: Impaired Factors Affecting Communication: Other (comment) (Thick [place] accent - hard to understand at times.)    Cognition Arousal: Alert Behavior During Therapy: WFL for tasks assessed/performed, Impulsive   PT - Cognitive impairments: History of cognitive impairments                         Following commands: Intact       Cueing Cueing Techniques: Verbal cues     General Comments      Exercises     Assessment/Plan    PT Assessment Patient needs continued PT services  PT Problem List Decreased strength;Decreased activity tolerance;Decreased balance;Decreased mobility;Decreased knowledge of use of DME;Decreased safety awareness;Decreased  knowledge of precautions;Decreased cognition       PT Treatment Interventions DME instruction;Gait training;Functional mobility training;Therapeutic activities;Therapeutic exercise;Balance training;Patient/family education    PT Goals (Current goals can be found in the Care Plan section)  Acute Rehab PT Goals Patient Stated Goal: Be able to go to [place] later this year with family PT Goal Formulation: With patient/family Time For Goal Achievement: 09/20/23 Potential to Achieve Goals: Good    Frequency Min 3X/week     Co-evaluation               AM-PAC PT "6 Clicks" Mobility  Outcome Measure Help needed turning from your back to your side while in a flat bed without using bedrails?: A Little Help needed moving from lying on your back to sitting on the side of a flat bed without using bedrails?: A Little Help needed moving to and from a bed to a chair (including a wheelchair)?: A Little Help needed standing up from a chair using your arms (e.g., wheelchair or bedside chair)?: A Little Help needed to walk in hospital room?: A Little Help needed climbing 3-5 steps with a railing? : A Little 6 Click Score: 18    End of Session Equipment Utilized During Treatment: Gait belt Activity  Tolerance: Patient tolerated treatment well Patient left: in chair;with call bell/phone within reach;with family/visitor present Nurse Communication: Mobility status PT Visit Diagnosis: Unsteadiness on feet (R26.81);Difficulty in walking, not elsewhere classified (R26.2)    Time: 1610-9604 PT Time Calculation (min) (ACUTE ONLY): 28 min   Charges:   PT Evaluation $PT Eval Moderate Complexity: 1 Mod PT Treatments $Gait Training: 8-22 mins PT General Charges $$ ACUTE PT VISIT: 1 Visit         Conni Slipper, PT, DPT Acute Rehabilitation Services Secure Chat Preferred Office: [phone]   Marylynn Pearson 09/13/2023, 3:15 PM

## 2023-09-13 NOTE — Assessment & Plan Note (Deleted)
 1.6 this morning. - S/p 2g IV Mg - AM Mg

## 2023-09-16 ENCOUNTER — Telehealth: Payer: Self-pay | Admitting: Family Medicine

## 2023-09-16 ENCOUNTER — Other Ambulatory Visit (HOSPITAL_COMMUNITY)
Admission: RE | Admit: 2023-09-16 | Discharge: 2023-09-16 | Disposition: A | Discharge status: Home / Self Care | Attending: Family Medicine | Admitting: Family Medicine

## 2023-09-16 DIAGNOSIS — N390 Urinary tract infection, site not specified: Secondary | ICD-10-CM | POA: Insufficient documentation

## 2023-09-16 NOTE — Addendum Note (Signed)
 Addended by: Penni Bowman T on: 09/16/2023 01:40 PM   Modules accepted: Orders

## 2023-09-16 NOTE — Telephone Encounter (Signed)
 Per Dr. Bobie Burton, an add on urine culture with sensitivity order using miscellaneous order request. Will adjust order if needed.

## 2023-09-16 NOTE — Telephone Encounter (Signed)
 I received a page from [facility] from the [facility] inpatient microbiology lab about the abrupt termination of urine culture sensitivity for Pseudomonas.  The request was to order a send-out a lab to [facility] to resume sensitivity testing. I called the inpatient pharmacy as instructed by Microbiology and spoke with Nellie Banas from the pharmacy team. She stated that since the patient had been discharged already, they couldn't enter sensitivity alone to [facility], and this needed to be completed by the Microbiology lab direction. She said I won't be able to enter it either. I called and relayed this information to [facility] and gave permission for the microbiology director to order sensitivity. I called and discussed this with the patient's husband after confirming the right patient.  She is doing well overall but still has urine incontinence. I will contact him once I have the final sensitivity result if needed. However, I advised PCP f/u for reassessment. He will call his PCP's office today for HFU and repeat urine culture. All questions were answered.

## 2023-09-18 LAB — MISC LABCORP TEST (SEND OUT)
LabCorp test name: 88021
Labcorp test code: 88021

## 2023-09-18 LAB — MIC RESULTS (4+ DRUGS)

## 2023-09-20 ENCOUNTER — Encounter: Payer: Self-pay | Admitting: Family Medicine

## 2023-09-20 LAB — SUSCEPTIBILITY RESULT

## 2023-09-24 LAB — MINIMUM INHIBITORY CONC (4+ DRUGS)

## 2023-09-26 LAB — URINE CULTURE: Culture: >=100000 — AB
